# Patient Record
Sex: Female | Born: 1980 | Race: White | Hispanic: No | Marital: Single | Smoking: Former smoker
Health system: Southern US, Community
[De-identification: ages and names within clinical notes are randomized; demographics above are authoritative.]

## PROBLEM LIST (undated history)

## (undated) DIAGNOSIS — F419 Anxiety disorder, unspecified: Secondary | ICD-10-CM

## (undated) DIAGNOSIS — N390 Urinary tract infection, site not specified: Secondary | ICD-10-CM

## (undated) DIAGNOSIS — F329 Major depressive disorder, single episode, unspecified: Secondary | ICD-10-CM

## (undated) DIAGNOSIS — B009 Herpesviral infection, unspecified: Secondary | ICD-10-CM

## (undated) DIAGNOSIS — F32A Depression, unspecified: Secondary | ICD-10-CM

## (undated) HISTORY — DX: Herpesviral infection, unspecified: B00.9

## (undated) HISTORY — PX: OTHER SURGICAL HISTORY: SHX169

## (undated) HISTORY — DX: Major depressive disorder, single episode, unspecified: F32.9

## (undated) HISTORY — DX: Depression, unspecified: F32.A

---

## 2011-11-18 ENCOUNTER — Ambulatory Visit: Payer: BC Managed Care – PPO

## 2011-11-18 ENCOUNTER — Inpatient Hospital Stay (HOSPITAL_COMMUNITY)
Admission: AD | Admit: 2011-11-18 | Discharge: 2011-11-18 | Disposition: A | Discharge status: Home / Self Care | Payer: BC Managed Care – PPO | Source: Ambulatory Visit | Attending: Family Medicine | Admitting: Family Medicine

## 2011-11-18 ENCOUNTER — Inpatient Hospital Stay (HOSPITAL_COMMUNITY): Payer: BC Managed Care – PPO

## 2011-11-18 ENCOUNTER — Encounter (HOSPITAL_COMMUNITY): Payer: Self-pay

## 2011-11-18 ENCOUNTER — Ambulatory Visit (INDEPENDENT_AMBULATORY_CARE_PROVIDER_SITE_OTHER): Payer: BC Managed Care – PPO | Admitting: Family Medicine

## 2011-11-18 VITALS — BP 128/86 | HR 124 | Temp 99.0°F | Resp 16 | Ht 64.5 in | Wt 180.6 lb

## 2011-11-18 DIAGNOSIS — R109 Unspecified abdominal pain: Secondary | ICD-10-CM

## 2011-11-18 DIAGNOSIS — IMO0002 Reserved for concepts with insufficient information to code with codable children: Secondary | ICD-10-CM | POA: Insufficient documentation

## 2011-11-18 DIAGNOSIS — O0389 Complete or unspecified spontaneous abortion with other complications: Secondary | ICD-10-CM

## 2011-11-18 DIAGNOSIS — N898 Other specified noninflammatory disorders of vagina: Secondary | ICD-10-CM

## 2011-11-18 HISTORY — DX: Anxiety disorder, unspecified: F41.9

## 2011-11-18 HISTORY — DX: Urinary tract infection, site not specified: N39.0

## 2011-11-18 LAB — POCT UA - MICROSCOPIC ONLY
Casts, Ur, LPF, POC: NEGATIVE
Crystals, Ur, HPF, POC: NEGATIVE
Mucus, UA: NEGATIVE
Yeast, UA: NEGATIVE

## 2011-11-18 LAB — POCT WET PREP WITH KOH
KOH Prep POC: NEGATIVE
Trichomonas, UA: NEGATIVE
Yeast Wet Prep HPF POC: NEGATIVE

## 2011-11-18 LAB — POCT CBC
Granulocyte percent: 76.3 % (ref 37–80)
HCT, POC: 38.3 % (ref 37.7–47.9)
Hemoglobin: 11.4 g/dL — AB (ref 12.2–16.2)
Lymph, poc: 1.9 (ref 0.6–3.4)
MCH, POC: 27 pg (ref 27–31.2)
MCHC: 29.8 g/dL — AB (ref 31.8–35.4)
MCV: 90.7 fL (ref 80–97)
MID (cbc): 0.8 (ref 0–0.9)
MPV: 7.4 fL (ref 0–99.8)
POC Granulocyte: 8.7 — AB (ref 2–6.9)
POC LYMPH PERCENT: 16.9 % (ref 10–50)
POC MID %: 6.8 %M (ref 0–12)
Platelet Count, POC: 380 10*3/uL (ref 142–424)
RBC: 4.22 M/uL (ref 4.04–5.48)
RDW, POC: 13.6 %
WBC: 11.4 10*3/uL — AB (ref 4.6–10.2)

## 2011-11-18 LAB — POCT URINALYSIS DIPSTICK
Bilirubin, UA: NEGATIVE
Glucose, UA: NEGATIVE
Ketones, UA: NEGATIVE
Nitrite, UA: NEGATIVE
Protein, UA: NEGATIVE
Spec Grav, UA: <=1.005
Urobilinogen, UA: 0.2
pH, UA: 7

## 2011-11-18 LAB — POCT URINE PREGNANCY: Preg Test, Ur: POSITIVE

## 2011-11-18 LAB — HCG, QUANTITATIVE, PREGNANCY: hCG, Beta Chain, Quant, S: 31.3 m[IU]/mL

## 2011-11-18 MED ORDER — HYDROCODONE-ACETAMINOPHEN 5-325 MG PO TABS: 1.0000 | ORAL_TABLET | ORAL | Status: DC | PRN

## 2011-11-18 MED ORDER — DOXYCYCLINE HYCLATE 100 MG PO TABS: 100.0000 mg | ORAL_TABLET | Freq: Two times a day (BID) | ORAL | Status: DC

## 2011-11-18 MED ORDER — DOXYCYCLINE HYCLATE 100 MG PO TABS
100.0000 mg | ORAL_TABLET | Freq: Two times a day (BID) | ORAL | Status: DC
  Filled 2011-11-18: qty 1

## 2011-11-18 MED ORDER — HYDROCODONE-ACETAMINOPHEN 5-500 MG PO TABS: 1.0000 | ORAL_TABLET | Freq: Four times a day (QID) | ORAL | Status: DC | PRN

## 2011-11-18 NOTE — MAU Note (Signed)
Pt states vaginal bleeding is small compared to the last 2wks

## 2011-11-18 NOTE — MAU Note (Signed)
Patent is in with c/o generalized abdominal pain and small vaginal bleeding. She states that she had an abortion 3 weeks. She states that she was at urgent care today, her bhcg is still elevated. She have on pad that is scant blood on it.

## 2011-11-18 NOTE — Progress Notes (Signed)
[place] [facility] notified of patient

## 2011-11-18 NOTE — MAU Provider Note (Signed)
History     CSN: [id number]  Arrival date and time: 11/18/11 4098   First Provider Initiated Contact with Patient 11/18/11 2023      Chief Complaint  Patient presents with  . Abdominal Pain  . Vaginal Bleeding   HPI  Lisa Valdez is a 31 y.o. G2P1011 who had an elective termination 3 weeks ago. She has been spotting since. She states that it is lighter than a period, but heavier than just spotting. She states that the bleeding alternates between red and brown. She is also having abdominal pain. She states that the pain is > on the right, but radiates to the left. She states that is is an 8/10, and she hasn't found anything to make it better.   Past Medical History  Diagnosis Date  . Depression   . Anxiety   . UTI (lower urinary tract infection)     Past Surgical History  Procedure Date  . Surgical abortion     Family History  Problem Relation Age of Onset  . Hypertension Mother     History  Substance Use Topics  . Smoking status: Current Some Day Smoker  . Smokeless tobacco: Not on file  . Alcohol Use: Yes    Allergies: No Known Allergies  Prescriptions prior to admission  Medication Sig Dispense Refill  . escitalopram (LEXAPRO) 5 MG tablet Take 5 mg by mouth daily.      Marland Kitchen ibuprofen (ADVIL,MOTRIN) 200 MG tablet Take 600 mg by mouth every 6 (six) hours as needed. Takes for pain      . UNKNOWN TO PATIENT Take 1 tablet by mouth daily. Birth control pill given to pt as sample does not know name        Review of Systems  Constitutional: Negative for fever and chills.  Eyes: Negative for blurred vision.  Respiratory: Negative for cough.   Cardiovascular: Negative for chest pain.  Gastrointestinal: Positive for abdominal pain (see HPI). Negative for heartburn, nausea, vomiting, diarrhea and constipation.  Genitourinary: Negative for dysuria, urgency and frequency.  Musculoskeletal: Negative for myalgias.  Neurological: Negative for headaches.   Physical  Exam   Blood pressure 149/98, pulse 119, temperature 99.9 F (37.7 C), temperature source Oral, resp. rate 20, height 5\' 3"  (1.6 m), weight 81.704 kg (180 lb 2 oz), SpO2 98.00%, unknown if currently breastfeeding.  Physical Exam  Nursing note and vitals reviewed. Constitutional: She is oriented to person, place, and time. She appears well-developed and well-nourished.  HENT:  Head: Normocephalic.  Cardiovascular: Normal rate and regular rhythm.   Respiratory: Effort normal.  GI: Soft. She exhibits no mass. There is tenderness (suprapubically and  > on the right than the left). There is no rebound and no guarding.  Genitourinary:        External: normal Vagina: normal. Small amount of brown discharge in vault Cervix: pink, smooth. CMT present Uterus: enlarged, about 11 week size Adnexa: nontender  Neurological: She is alert and oriented to person, place, and time.    MAU Course  Procedures  TRANSABDOMINAL AND TRANSVAGINAL ULTRASOUND OF PELVIS  Technique: Both transabdominal and transvaginal ultrasound  examinations of the pelvis were performed. Transabdominal technique  was performed for global imaging of the pelvis including uterus,  ovaries, adnexal regions, and pelvic cul-de-sac.  It was necessary to proceed with endovaginal exam following the  transabdominal exam to visualize the uterus, ovaries, and adnexa .  Comparison: Plain films of the abdomen of earlier in the day.  Findings:  Uterus:  6.5 x 4.7 x 6.5 cm. Mildly heterogeneous myometrium,  likely postpartum.  Endometrium: Normal in thickness of 5 mm. Mildly heterogeneous.  No retained fetal parts.  Right ovary: Normal appearance/no adnexal mass  Left ovary: Normal appearance/no adnexal mass  Other findings: No free fluid  IMPRESSION:  Normal endometrial thickness with mild heterogeneity, nonspecific  in the post gravid state. No specific findings to strongly suggest  retained products of conception.  No other  explanation for pain.  Original Report Authenticated By: Consuello Bossier, M.D.   22:36 Spoke with Dr. Shawnie Pons, and reviewed ultrasound results. 10 days of doxycycline  Assessment and Plan   1. Infection post-abortion   Doxycycline 100mg  BID X 10days FU with PCP as scheduled.   Tawnya Crook 11/18/2011, 8:43 PM

## 2011-11-18 NOTE — Progress Notes (Signed)
 [facility]:  Office Visit  Chief Complaint:  Chief Complaint  Patient presents with  . Abdominal Pain    3 weeks ago had an abortion    HPI: Lisa Valdez is a 31 y.o. female G2A1L1 who complains of: 10/10 worsening bilateral abdominal cramps x 2-3 days. 3 weeks ago had a vaginal abortion via suction off [address]. Fetus was @ 11 weeks. She has had bleeding but it has decreased and it is now dark. She has had some minimal vaginal discharge. Patient took a home pregnancy test today and it was negative.   Past Medical History  Diagnosis Date  . Depression    Past Surgical History  Procedure Date  . Surgical abortion    History   Social History  . Marital Status: Single    Spouse Name: N/A    Number of Children: N/A  . Years of Education: N/A   Social History Main Topics  . Smoking status: Never Smoker   . Smokeless tobacco: None  . Alcohol Use: Yes  . Drug Use: No  . Sexually Active: None   Other Topics Concern  . None   Social History Narrative  . None   History reviewed. No pertinent family history. No Known Allergies Prior to Admission medications   Medication Sig Start Date End Date Taking? Authorizing Provider  escitalopram (LEXAPRO) 5 MG tablet Take 5 mg by mouth daily.   Yes Historical Provider, MD  UNKNOWN TO PATIENT Take 1 tablet by mouth daily. Birth control pill given to pt as sample does not know name   Yes Historical Provider, MD     ROS: The patient denies fevers, chills, night sweats, unintentional weight loss, chest pain, palpitations, wheezing, dyspnea on exertion, vomiting,  dysuria, hematuria, melena, numbness, weakness, or tingling.   All other systems have been reviewed and were otherwise negative with the exception of those mentioned in the HPI and as above.    PHYSICAL EXAM: Filed Vitals:   11/18/11 1624  BP: 128/86  Pulse: 124  Temp: 99 F (37.2 C)  Resp: 16   Filed Vitals:   11/18/11 1624  Height: 5'  4.5" (1.638 m)  Weight: 180 lb 9.6 oz (81.92 kg)   Body mass index is 30.52 kg/(m^2).  General: Alert, mild distress HEENT:  Normocephalic, atraumatic, oropharynx patent. EOMI Cardiovascular:  Regular rate and rhythm, no rubs murmurs or gallops.  No Carotid bruits, radial pulse intact. No pedal edema.  Respiratory: Clear to auscultation bilaterally.  No wheezes, rales, or rhonchi.  No cyanosis, no use of accessory musculature GI: No organomegaly, abdomen is soft but exquisitely tender bilateral upper quadrant, positive bowel sounds.  No masses. No rebound, no guarding Skin: No rashes. Neurologic: Facial musculature symmetric. Psychiatric: Patient is appropriate throughout our interaction. Lymphatic: No cervical lymphadenopathy Musculoskeletal: Gait intact. GU-cervical os closed, + purulent dc   LABS: Results for orders placed in visit on 11/18/11  POCT CBC      Component Value Range   WBC 11.4 (*) 4.6 - 10.2 K/uL   Lymph, poc 1.9  0.6 - 3.4   POC LYMPH PERCENT 16.9  10 - 50 %L   MID (cbc) 0.8  0 - 0.9   POC MID % 6.8  0 - 12 %M   POC Granulocyte 8.7 (*) 2 - 6.9   Granulocyte percent 76.3  37 - 80 %G   RBC 4.22  4.04 - 5.48 M/uL   Hemoglobin 11.4 (*) 12.2 - 16.2  g/dL   HCT, POC 04.5  40.9 - 47.9 %   MCV 90.7  80 - 97 fL   MCH, POC 27.0  27 - 31.2 pg   MCHC 29.8 (*) 31.8 - 35.4 g/dL   RDW, POC 81.1     Platelet Count, POC 380  142 - 424 K/uL   MPV 7.4  0 - 99.8 fL  POCT UA - MICROSCOPIC ONLY      Component Value Range   WBC, Ur, HPF, POC 3-5     RBC, urine, microscopic 0-1     Bacteria, U Microscopic small     Mucus, UA negative     Epithelial cells, urine per micros 0-3     Crystals, Ur, HPF, POC negative     Casts, Ur, LPF, POC negative     Yeast, UA negative    POCT URINALYSIS DIPSTICK      Component Value Range   Color, UA yellow     Clarity, UA clear     Glucose, UA neg     Bilirubin, UA neg     Ketones, UA neg     Spec Grav, UA <=1.005     Blood, UA trace      pH, UA 7.0     Protein, UA neg     Urobilinogen, UA 0.2     Nitrite, UA neg     Leukocytes, UA small (1+)    POCT URINE PREGNANCY      Component Value Range   Preg Test, Ur Positive    POCT WET PREP WITH KOH      Component Value Range   Trichomonas, UA Negative     Clue Cells Wet Prep HPF POC 1-2     Epithelial Wet Prep HPF POC 1-3     Yeast Wet Prep HPF POC neg     Bacteria Wet Prep HPF POC 2+     RBC Wet Prep HPF POC 1-3     WBC Wet Prep HPF POC 20-25     KOH Prep POC Negative       EKG/XRAY:   Primary read interpreted by Dr. Conley Rolls at [facility]. No acute process   ASSESSMENT/PLAN: Encounter Diagnoses  Name Primary?  . Abdominal cramps Yes  . Vaginal discharge    31 y/o  G65A1L1  White female with worsening abdominal cramps 3 weeks s/p abortion with elevated WBC and pretty unremarkable UA and wet prep.   ? Retained products of conception . I suspect her hcg to be elevated after such a short time after an abortion but I am worried that with an elevated white count and severe abdominal bilateral tenderness this needs further evaluation. Called OB/Gyn on call and asked for feedback. Dr. Royetta Crochet recommended sending her to [facility] ER for evaluation.  Pending labs: serum quant hcg, G/C gen probe, urine culture.     ,  PHUONG, DO 11/18/2011 6:42 PM

## 2011-11-19 ENCOUNTER — Telehealth: Payer: Self-pay | Admitting: Family Medicine

## 2011-11-19 ENCOUNTER — Encounter: Payer: Self-pay | Admitting: Family Medicine

## 2011-11-19 NOTE — MAU Provider Note (Signed)
Chart reviewed and agree with management and plan.  

## 2011-11-19 NOTE — Telephone Encounter (Signed)
LM regarding labs and checking in on how she is doing

## 2011-11-20 LAB — URINE CULTURE: Colony Count: 8000

## 2011-11-22 ENCOUNTER — Telehealth: Payer: Self-pay | Admitting: Family Medicine

## 2011-11-22 LAB — GC/CHLAMYDIA PROBE AMP, GENITAL
Chlamydia, DNA Probe: POSITIVE — AB
GC Probe Amp, Genital: NEGATIVE

## 2011-11-22 NOTE — Telephone Encounter (Signed)
patient was returning Dr. Conley Rolls call. Per Dr. Conley Rolls notify patient Chlamydia was positive and the doxycycline she put her on is appropriate treatment. She hopes she feels better. Patient stated she is feeling much better sxs have improved. Dr. Sundra Aland  I will sent [facility] card on this.

## 2011-11-22 NOTE — Telephone Encounter (Signed)
LM that I wanted to talk to her about labs. If she calls, one of the folks in the lab can let her know that she has Chlamydia and that was the cause of all her sxs She was appropriately treated with Doxycycline. IF she stopped taking that before she finished the rx then she needs to let us know. IF she wants her test results then she can pick it up at the office or we can mail it to her.

## 2012-01-05 ENCOUNTER — Emergency Department (HOSPITAL_COMMUNITY)
Admission: EM | Admit: 2012-01-05 | Discharge: 2012-01-05 | Disposition: A | Discharge status: Home / Self Care | Payer: BC Managed Care – PPO | Source: Home / Self Care

## 2012-01-05 ENCOUNTER — Encounter (HOSPITAL_COMMUNITY): Payer: Self-pay

## 2012-01-05 ENCOUNTER — Other Ambulatory Visit (HOSPITAL_COMMUNITY)
Admission: RE | Admit: 2012-01-05 | Discharge: 2012-01-05 | Disposition: A | Discharge status: Home / Self Care | Payer: BC Managed Care – PPO | Source: Ambulatory Visit | Attending: Family Medicine | Admitting: Family Medicine

## 2012-01-05 DIAGNOSIS — Z202 Contact with and (suspected) exposure to infections with a predominantly sexual mode of transmission: Secondary | ICD-10-CM

## 2012-01-05 DIAGNOSIS — Z113 Encounter for screening for infections with a predominantly sexual mode of transmission: Secondary | ICD-10-CM | POA: Insufficient documentation

## 2012-01-05 DIAGNOSIS — Z2089 Contact with and (suspected) exposure to other communicable diseases: Secondary | ICD-10-CM

## 2012-01-05 MED ORDER — AZITHROMYCIN 250 MG PO TABS
ORAL_TABLET | ORAL | Status: AC
  Filled 2012-01-05: qty 4

## 2012-01-05 MED ORDER — AZITHROMYCIN 250 MG PO TABS
1000.0000 mg | ORAL_TABLET | Freq: Every day | ORAL | Status: DC
  Administered 2012-01-05: 1000 mg via ORAL

## 2012-01-05 NOTE — ED Provider Notes (Signed)
History     CSN: [id number]  Arrival date & time 01/05/12  1806   First MD Initiated Contact with Patient 01/05/12 2014      Chief Complaint  Patient presents with  . Exposure to STD    (Consider location/radiation/quality/duration/timing/severity/associated sxs/prior treatment) HPI Comments: 31 year old female states he was to be tested for chlamydia. Her sexual partner was told recently that he had Chlamydia and she is warning to be tested and treated based on information. She is asymptomatic with exception of a scant vaginal discharge for 2 days. She has no pelvic pain or other symptoms. She declines to have a pelvic exam for further testing. We did obtain a urine for testing of the chlamydia and GC.  Patient is a 31 y.o. female presenting with STD exposure.  Exposure to STD    Past Medical History  Diagnosis Date  . Depression   . Anxiety   . UTI (lower urinary tract infection)     Past Surgical History  Procedure Date  . Surgical abortion     Family History  Problem Relation Age of Onset  . Hypertension Mother     History  Substance Use Topics  . Smoking status: Current Some Day Smoker  . Smokeless tobacco: Not on file  . Alcohol Use: Yes    OB History    Grav Para Term Preterm Abortions TAB SAB Ect Mult Living   2 1 1  1 1    1       Review of Systems  All other systems reviewed and are negative.    Allergies  Review of patient's allergies indicates no known allergies.  Home Medications   Current Outpatient Rx  Name  Route  Sig  Dispense  Refill  . ESCITALOPRAM OXALATE 10 MG PO TABS   Oral   Take 10 mg by mouth daily.         Marland Kitchen DOXYCYCLINE HYCLATE 100 MG PO TABS   Oral   Take 1 tablet (100 mg total) by mouth every 12 (twelve) hours.   19 tablet   0   . ESCITALOPRAM OXALATE 5 MG PO TABS   Oral   Take 5 mg by mouth daily.         Marland Kitchen HYDROCODONE-ACETAMINOPHEN 5-500 MG PO TABS   Oral   Take 1 tablet by mouth every 6 (six) hours as  needed for pain.   10 tablet   0   . IBUPROFEN 200 MG PO TABS   Oral   Take 600 mg by mouth every 6 (six) hours as needed. Takes for pain         . UNKNOWN TO PATIENT   Oral   Take 1 tablet by mouth daily. Birth control pill given to pt as sample does not know name           BP 144/97  Pulse 90  Temp 98.1 F (36.7 C) (Oral)  Resp 16  SpO2 99%  LMP 12/28/2011  Physical Exam  Nursing note and vitals reviewed. Constitutional: She is oriented to person, place, and time. She appears well-developed and well-nourished. No distress.  Eyes: Conjunctivae normal and EOM are normal.  Neck: Normal range of motion. Neck supple.  Pulmonary/Chest: Effort normal.  Musculoskeletal: Normal range of motion.  Neurological: She is alert and oriented to person, place, and time.  Skin: Skin is warm and dry.  Psychiatric: She has a normal mood and affect.    ED Course  Procedures (including critical care  time)   Labs Reviewed  URINE CYTOLOGY ANCILLARY ONLY   No results found.   1. Exposure to STD       MDM  Patient declined having a pelvic exam and further testing. She only requests confirmation of Chlamydia and treatment. Be obtained a urine and we will test for GC and Chlamydia. Treat with 1 g azithromycin tonight.        Hayden Rasmussen, NP 01/05/12 2037

## 2012-01-05 NOTE — ED Notes (Signed)
Patient states that her boyfriend told her that he tested positive for chlamydia, she would like to be tested and treated today

## 2012-01-08 NOTE — ED Provider Notes (Signed)
Medical screening examination/treatment/procedure(s) were performed by resident physician or non-physician practitioner and as supervising physician I was immediately available for consultation/collaboration.   Ellakate Gonsalves DOUGLAS MD.    Samyah Bilbo D Caliah Kopke, MD 01/08/12 1110 

## 2012-02-10 ENCOUNTER — Ambulatory Visit (INDEPENDENT_AMBULATORY_CARE_PROVIDER_SITE_OTHER): Payer: BC Managed Care – PPO | Admitting: Physician Assistant

## 2012-02-10 VITALS — BP 144/93 | HR 94 | Temp 98.6°F | Resp 16 | Ht 64.0 in | Wt 177.0 lb

## 2012-02-10 DIAGNOSIS — R059 Cough, unspecified: Secondary | ICD-10-CM

## 2012-02-10 DIAGNOSIS — R05 Cough: Secondary | ICD-10-CM

## 2012-02-10 DIAGNOSIS — J329 Chronic sinusitis, unspecified: Secondary | ICD-10-CM

## 2012-02-10 DIAGNOSIS — Z131 Encounter for screening for diabetes mellitus: Secondary | ICD-10-CM

## 2012-02-10 LAB — POCT CBC
Granulocyte percent: 55.6 %G (ref 37–80)
HCT, POC: 41 % (ref 37.7–47.9)
Hemoglobin: 12.8 g/dL (ref 12.2–16.2)
Lymph, poc: 3.2 (ref 0.6–3.4)
MCH, POC: 27.8 pg (ref 27–31.2)
MCHC: 31.2 g/dL — AB (ref 31.8–35.4)
MCV: 89.1 fL (ref 80–97)
MID (cbc): 0.6 (ref 0–0.9)
MPV: 8.1 fL (ref 0–99.8)
POC Granulocyte: 4.8 (ref 2–6.9)
POC LYMPH PERCENT: 37.3 %L (ref 10–50)
POC MID %: 7.1 %M (ref 0–12)
Platelet Count, POC: 313 10*3/uL (ref 142–424)
RBC: 4.6 M/uL (ref 4.04–5.48)
RDW, POC: 13.4 %
WBC: 8.7 10*3/uL (ref 4.6–10.2)

## 2012-02-10 LAB — GLUCOSE, POCT (MANUAL RESULT ENTRY): POC Glucose: 95 mg/dl (ref 70–99)

## 2012-02-10 MED ORDER — FLUTICASONE PROPIONATE 50 MCG/ACT NA SUSP: 2.0000 | Freq: Every day | NASAL | Status: DC

## 2012-02-10 MED ORDER — HYDROCOD POLST-CHLORPHEN POLST 10-8 MG/5ML PO LQCR: 5.0000 mL | Freq: Two times a day (BID) | ORAL | Status: DC | PRN

## 2012-02-10 MED ORDER — AMOXICILLIN-POT CLAVULANATE 875-125 MG PO TABS: 1.0000 | ORAL_TABLET | Freq: Two times a day (BID) | ORAL | Status: DC

## 2012-02-10 MED ORDER — PREDNISONE 20 MG PO TABS: ORAL_TABLET | ORAL | Status: DC

## 2012-02-10 NOTE — Progress Notes (Signed)
  Subjective:    Patient ID: Lisa Valdez, female    DOB: September 26, 1980, 32 y.o.   MRN: [id number]  HPI 32 year old female presents with 1 week history of nasal congestion, sinus pressure, sore throat, dry cough, and thick nasal congestion.  Does have a history of sinus infections in the past with the last being >1 year ago.  Denies fever, chills, nausea, vomiting, vision changes, neck pain, or otalgia. She has been using sudafed regularly which has not helped much.  She is otherwise healthy with no other complaints today.      Review of Systems  Constitutional: Negative for fever and chills.  HENT: Positive for congestion, sore throat and sinus pressure. Negative for ear pain and neck pain.   Respiratory: Positive for cough. Negative for chest tightness, shortness of breath and wheezing.   Gastrointestinal: Negative for nausea, vomiting and abdominal pain.  Skin: Negative for rash.  Neurological: Negative for dizziness, light-headedness and headaches.  All other systems reviewed and are negative.       Objective:   Physical Exam  Constitutional: She is oriented to person, place, and time. She appears well-developed and well-nourished.  HENT:  Head: Normocephalic and atraumatic.  Right Ear: Hearing, tympanic membrane, external ear and ear canal normal.  Left Ear: Hearing, tympanic membrane, external ear and ear canal normal.  Nose: Right sinus exhibits maxillary sinus tenderness. Right sinus exhibits no frontal sinus tenderness. Left sinus exhibits maxillary sinus tenderness. Left sinus exhibits no frontal sinus tenderness.  Mouth/Throat: Oropharynx is clear and moist. No oropharyngeal exudate (clear postnasal drainage).  Eyes: Conjunctivae normal are normal.  Neck: Normal range of motion. Neck supple.  Cardiovascular: Normal rate, regular rhythm and normal heart sounds.   Pulmonary/Chest: Effort normal and breath sounds normal.  Lymphadenopathy:    She has no cervical adenopathy.    Neurological: She is alert and oriented to person, place, and time.  Psychiatric: She has a normal mood and affect. Her behavior is normal. Judgment and thought content normal.          Assessment & Plan:   1. Sinus infection  amoxicillin-clavulanate (AUGMENTIN) 875-125 MG per tablet, fluticasone (FLONASE) 50 MCG/ACT nasal spray, POCT CBC  2. Cough  chlorpheniramine-HYDROcodone (TUSSIONEX PENNKINETIC ER) 10-8 MG/5ML LQCR  3. Screening for diabetes mellitus  POCT glucose (manual entry)  Augmentin 875 mg bid Prednisone taper Flonase bid Tussionex q12hours prn cough Follow up if symptoms worsen or fail to improve.

## 2012-06-13 ENCOUNTER — Other Ambulatory Visit (INDEPENDENT_AMBULATORY_CARE_PROVIDER_SITE_OTHER): Payer: BC Managed Care – PPO

## 2012-06-13 ENCOUNTER — Ambulatory Visit (INDEPENDENT_AMBULATORY_CARE_PROVIDER_SITE_OTHER): Payer: BC Managed Care – PPO | Admitting: Obstetrics

## 2012-06-13 ENCOUNTER — Encounter: Payer: Self-pay | Admitting: Obstetrics

## 2012-06-13 VITALS — BP 129/92 | HR 95 | Temp 98.3°F | Ht 64.0 in | Wt 185.0 lb

## 2012-06-13 DIAGNOSIS — Z113 Encounter for screening for infections with a predominantly sexual mode of transmission: Secondary | ICD-10-CM

## 2012-06-13 DIAGNOSIS — N949 Unspecified condition associated with female genital organs and menstrual cycle: Secondary | ICD-10-CM | POA: Insufficient documentation

## 2012-06-13 DIAGNOSIS — R1032 Left lower quadrant pain: Secondary | ICD-10-CM

## 2012-06-13 DIAGNOSIS — Z01419 Encounter for gynecological examination (general) (routine) without abnormal findings: Secondary | ICD-10-CM

## 2012-06-13 LAB — POCT URINALYSIS DIPSTICK
Glucose, UA: NEGATIVE
Spec Grav, UA: 1.015
Urobilinogen, UA: NEGATIVE

## 2012-06-13 LAB — CBC WITH DIFFERENTIAL/PLATELET
Basophils Absolute: 0 10*3/uL (ref 0.0–0.1)
Basophils Relative: 0 % (ref 0–1)
Lymphocytes Relative: 42 % (ref 12–46)
MCHC: 33.7 g/dL (ref 30.0–36.0)
Neutro Abs: 3.7 10*3/uL (ref 1.7–7.7)
Neutrophils Relative %: 49 % (ref 43–77)
Platelets: 348 10*3/uL (ref 150–400)
RDW: 12.7 % (ref 11.5–15.5)
WBC: 7.5 10*3/uL (ref 4.0–10.5)

## 2012-06-13 LAB — COMPREHENSIVE METABOLIC PANEL
Albumin: 4.1 g/dL (ref 3.5–5.2)
CO2: 22 mEq/L (ref 19–32)
Calcium: 9.2 mg/dL (ref 8.4–10.5)
Chloride: 104 mEq/L (ref 96–112)
Glucose, Bld: 74 mg/dL (ref 70–99)
Potassium: 4.1 mEq/L (ref 3.5–5.3)
Sodium: 137 mEq/L (ref 135–145)
Total Bilirubin: 0.5 mg/dL (ref 0.3–1.2)
Total Protein: 6.7 g/dL (ref 6.0–8.3)

## 2012-06-13 LAB — TSH: TSH: 1.265 u[IU]/mL (ref 0.350–4.500)

## 2012-06-13 MED ORDER — IBUPROFEN 800 MG PO TABS: 800.0000 mg | ORAL_TABLET | Freq: Three times a day (TID) | ORAL | Status: DC | PRN

## 2012-06-13 NOTE — Progress Notes (Signed)
Subjective:     Lisa Valdez is a 32 y.o. female here routine examinaion.  Current complaints: abdominal pain.     Gynecologic History Patient's last menstrual period was 05/23/2012. Contraception: OCP (estrogen/progesterone) Last PAP: 2012, results: normal; Mammogram: 2011, results: normal   Obstetric History OB History   Grav Para Term Preterm Abortions TAB SAB Ect Mult Living   2 1 1  1 1    1      # Outc Date GA Lbr Len/2nd Wgt Sex Del Anes PTL Lv   1 TRM            2 TAB                The following portions of the patient's history were reviewed and updated as appropriate: allergies, current medications, past family history, past medical history, past social history, past surgical history and problem list.  Review of Systems Pertinent items are noted in HPI.    Objective:    General appearance: alert and no distress Abdomen: normal findings: soft, non-tender Pelvic: cervix normal in appearance, exam obscured by obesity, external genitalia normal, no cervical motion tenderness, uterus normal size, shape, and consistency, vagina normal without discharge and Adnexal tenderness present.    Assessment:    Healthy female exam.   R/O ovarian cyst Plan:    Follow up in: 2 weeks.    Ultrasound scheduled.

## 2012-06-14 ENCOUNTER — Ambulatory Visit (INDEPENDENT_AMBULATORY_CARE_PROVIDER_SITE_OTHER): Payer: BC Managed Care – PPO | Admitting: Family Medicine

## 2012-06-14 ENCOUNTER — Ambulatory Visit: Payer: BC Managed Care – PPO | Admitting: Obstetrics & Gynecology

## 2012-06-14 ENCOUNTER — Encounter: Payer: Self-pay | Admitting: Obstetrics

## 2012-06-14 VITALS — BP 132/86 | HR 95 | Temp 99.2°F | Resp 16 | Ht 63.0 in | Wt 186.0 lb

## 2012-06-14 DIAGNOSIS — K219 Gastro-esophageal reflux disease without esophagitis: Secondary | ICD-10-CM

## 2012-06-14 DIAGNOSIS — R109 Unspecified abdominal pain: Secondary | ICD-10-CM

## 2012-06-14 LAB — WET PREP BY MOLECULAR PROBE
Gardnerella vaginalis: POSITIVE — AB
Trichomonas vaginosis: NEGATIVE

## 2012-06-14 LAB — PAP IG W/ RFLX HPV ASCU

## 2012-06-14 LAB — HEPATITIS B SURFACE ANTIGEN: Hepatitis B Surface Ag: NEGATIVE

## 2012-06-14 MED ORDER — OMEPRAZOLE 40 MG PO CPDR: 40.0000 mg | DELAYED_RELEASE_CAPSULE | Freq: Every day | ORAL | Status: DC

## 2012-06-14 NOTE — Progress Notes (Signed)
  Subjective:    Patient ID: Lisa Valdez, female    DOB: 1981/01/15, 32 y.o.   MRN: [id number]  HPI Patient is a 32 year old female coming in with abdominal pain for 4 day history. Patient states that she's been having midepigastric pain for quite some time. This pain has woken up at night. Patient denies any radiation of pain, denies any bowel or bladder changes, denies any darkening of the stool. Patient also denies any fevers or chills or any nausea or vomiting. Patient states that the pain seems to be more in the midepigastric that does radiate down her esophagus. Patient does wake up with a bitter taste in her mouth at approximately 3 AM every morning. Patient states that she has been having more stress but denies any association with food at this time the   Review of Systems As stated above in the history of present illness  Past Medical History  Diagnosis Date  . Depression   . Anxiety   . UTI (lower urinary tract infection)   . HSV-2 (herpes simplex virus 2) infection    Past Surgical History  Procedure Laterality Date  . Surgical abortion     Family History  Problem Relation Age of Onset  . Hypertension Mother    History  Substance Use Topics  . Smoking status: Current Every Day Smoker -- 0.30 packs/day for 2 years    Types: Cigarettes  . Smokeless tobacco: Never Used  . Alcohol Use: Yes     Comment: rare        Objective:   Physical Exam Blood pressure 132/86, pulse 95, temperature 99.2 F (37.3 C), temperature source Oral, resp. rate 16, height 5\' 3"  (1.6 m), weight 186 lb (84.369 kg), last menstrual period 05/23/2012, SpO2 99.00%. General: No apparent distress alert and oriented x3 mood and affect normal Respiratory: Patient's speak in full sentences and does not appear short of breath Skin: Warm dry intact with no signs of infection or rash Neuro: Cranial nerves II through XII are intact, neurovascularly intact in all extremities with 2+ DTRs and 2+  pulses. Abdominal exam: Thousand positive, patient is tender in the epigastric region but has no involuntary guarding and no rebound tenderness. No masses palpated       Assessment & Plan:  Gastritis likely gastroesophageal reflux disease  Patient was given a GI cocktail and did have improvement of symptoms. In addition this patient will do omeprazole high dose twice a day for the next week once daily thereafter. Patient will followup in one week to make sure she continues to improve. Patient was given a handout discussing different symptoms to make her seek medical attention sooner. We'll see patient again in one week

## 2012-06-14 NOTE — Patient Instructions (Signed)
Very nice to meet you I am giving you a pill called omeprazole.  Take it twice daily for 1 week then daily thereafter.  Come back again in 1 week to tell me how the party went, and I should also check to make sure you are feeling better.  My name is Dr. Katrinka Blazing.     Gastritis, Adult Gastritis is soreness and swelling (inflammation) of the lining of the stomach. Gastritis can develop as a sudden onset (acute) or long-term (chronic) condition. If gastritis is not treated, it can lead to stomach bleeding and ulcers. CAUSES  Gastritis occurs when the stomach lining is weak or damaged. Digestive juices from the stomach then inflame the weakened stomach lining. The stomach lining may be weak or damaged due to viral or bacterial infections. One common bacterial infection is the Helicobacter pylori infection. Gastritis can also result from excessive alcohol consumption, taking certain medicines, or having too much acid in the stomach.  SYMPTOMS  In some cases, there are no symptoms. When symptoms are present, they may include:  Pain or a burning sensation in the upper abdomen.  Nausea.  Vomiting.  An uncomfortable feeling of fullness after eating. DIAGNOSIS  Your caregiver may suspect you have gastritis based on your symptoms and a physical exam. To determine the cause of your gastritis, your caregiver may perform the following:  Blood or stool tests to check for the H pylori bacterium.  Gastroscopy. A thin, flexible tube (endoscope) is passed down the esophagus and into the stomach. The endoscope has a light and camera on the end. Your caregiver uses the endoscope to view the inside of the stomach.  Taking a tissue sample (biopsy) from the stomach to examine under a microscope. TREATMENT  Depending on the cause of your gastritis, medicines may be prescribed. If you have a bacterial infection, such as an H pylori infection, antibiotics may be given. If your gastritis is caused by too much acid in  the stomach, H2 blockers or antacids may be given. Your caregiver may recommend that you stop taking aspirin, ibuprofen, or other nonsteroidal anti-inflammatory drugs (NSAIDs). HOME CARE INSTRUCTIONS  Only take over-the-counter or prescription medicines as directed by your caregiver.  If you were given antibiotic medicines, take them as directed. Finish them even if you start to feel better.  Drink enough fluids to keep your urine clear or pale yellow.  Avoid foods and drinks that make your symptoms worse, such as:  Caffeine or alcoholic drinks.  Chocolate.  Peppermint or mint flavorings.  Garlic and onions.  Spicy foods.  Citrus fruits, such as oranges, lemons, or limes.  Tomato-based foods such as sauce, chili, salsa, and pizza.  Fried and fatty foods.  Eat small, frequent meals instead of large meals. SEEK IMMEDIATE MEDICAL CARE IF:   You have black or dark red stools.  You vomit blood or material that looks like coffee grounds.  You are unable to keep fluids down.  Your abdominal pain gets worse.  You have a fever.  You do not feel better after 1 week.  You have any other questions or concerns. MAKE SURE YOU:  Understand these instructions.  Will watch your condition.  Will get help right away if you are not doing well or get worse. Document Released: 01/12/2001 Document Revised: 07/20/2011 Document Reviewed: 03/03/2011 [facility] Patient Information 2013 [place], [place].

## 2012-06-21 ENCOUNTER — Encounter: Payer: Self-pay | Admitting: Obstetrics

## 2012-07-11 ENCOUNTER — Encounter: Payer: Self-pay | Admitting: Obstetrics

## 2012-08-11 ENCOUNTER — Ambulatory Visit: Payer: BC Managed Care – PPO | Admitting: Obstetrics & Gynecology

## 2012-08-11 ENCOUNTER — Ambulatory Visit (INDEPENDENT_AMBULATORY_CARE_PROVIDER_SITE_OTHER): Payer: BC Managed Care – PPO | Admitting: Obstetrics & Gynecology

## 2012-08-11 ENCOUNTER — Encounter: Payer: Self-pay | Admitting: Obstetrics & Gynecology

## 2012-08-11 VITALS — BP 124/76 | HR 74 | Temp 97.8°F | Wt 188.0 lb

## 2012-08-11 DIAGNOSIS — Z309 Encounter for contraceptive management, unspecified: Secondary | ICD-10-CM

## 2012-08-11 DIAGNOSIS — Z3202 Encounter for pregnancy test, result negative: Secondary | ICD-10-CM

## 2012-08-11 DIAGNOSIS — Z30430 Encounter for insertion of intrauterine contraceptive device: Secondary | ICD-10-CM

## 2012-08-11 MED ORDER — LEVONORGESTREL 20 MCG/24HR IU IUD: INTRAUTERINE_SYSTEM | Freq: Once | INTRAUTERINE | Status: AC

## 2012-08-11 NOTE — Patient Instructions (Addendum)
Levonorgestrel intrauterine device (IUD) What is this medicine? LEVONORGESTREL IUD (LEE voe nor jes trel) is a contraceptive (birth control) device. The device is placed inside the uterus by a healthcare professional. It is used to prevent pregnancy and can also be used to treat heavy bleeding that occurs during your period. Depending on the device, it can be used for 3 to 5 years. This medicine may be used for other purposes; ask your health care provider or pharmacist if you have questions. What should I tell my health care provider before I take this medicine? They need to know if you have any of these conditions: -abnormal Pap smear -cancer of the breast, uterus, or cervix -diabetes -endometritis -genital or pelvic infection now or in the past -have more than one sexual partner or your partner has more than one partner -heart disease -history of an ectopic or tubal pregnancy -immune system problems -IUD in place -liver disease or tumor -problems with blood clots or take blood-thinners -use intravenous drugs -uterus of unusual shape -vaginal bleeding that has not been explained -an unusual or allergic reaction to levonorgestrel, other hormones, silicone, or polyethylene, medicines, foods, dyes, or preservatives -pregnant or trying to get pregnant -breast-feeding How should I use this medicine? This device is placed inside the uterus by a health care professional. Talk to your pediatrician regarding the use of this medicine in children. Special care may be needed. Overdosage: If you think you have taken too much of this medicine contact a poison control center or emergency room at once. NOTE: This medicine is only for you. Do not share this medicine with others. What if I miss a dose? This does not apply. What may interact with this medicine? Do not take this medicine with any of the following medications: -amprenavir -bosentan -fosamprenavir This medicine may also interact with  the following medications: -aprepitant -barbiturate medicines for inducing sleep or treating seizures -bexarotene -griseofulvin -medicines to treat seizures like carbamazepine, ethotoin, felbamate, oxcarbazepine, phenytoin, topiramate -modafinil -pioglitazone -rifabutin -rifampin -rifapentine -some medicines to treat HIV infection like atazanavir, indinavir, lopinavir, nelfinavir, tipranavir, ritonavir -St. John's wort -warfarin This list may not describe all possible interactions. Give your health care provider a list of all the medicines, herbs, non-prescription drugs, or dietary supplements you use. Also tell them if you smoke, drink alcohol, or use illegal drugs. Some items may interact with your medicine. What should I watch for while using this medicine? Visit your doctor or health care professional for regular check ups. See your doctor if you or your partner has sexual contact with others, becomes HIV positive, or gets a sexual transmitted disease. This product does not protect you against HIV infection (AIDS) or other sexually transmitted diseases. You can check the placement of the IUD yourself by reaching up to the top of your vagina with clean fingers to feel the threads. Do not pull on the threads. It is a good habit to check placement after each menstrual period. Call your doctor right away if you feel more of the IUD than just the threads or if you cannot feel the threads at all. The IUD may come out by itself. You may become pregnant if the device comes out. If you notice that the IUD has come out use a backup birth control method like condoms and call your health care provider. Using tampons will not change the position of the IUD and are okay to use during your period. What side effects may I notice from receiving this medicine?   Side effects that you should report to your doctor or health care professional as soon as possible: -allergic reactions like skin rash, itching or  hives, swelling of the face, lips, or tongue -fever, flu-like symptoms -genital sores -high blood pressure -no menstrual period for 6 weeks during use -pain, swelling, warmth in the leg -pelvic pain or tenderness -severe or sudden headache -signs of pregnancy -stomach cramping -sudden shortness of breath -trouble with balance, talking, or walking -unusual vaginal bleeding, discharge -yellowing of the eyes or skin Side effects that usually do not require medical attention (report to your doctor or health care professional if they continue or are bothersome): -acne -breast pain -change in sex drive or performance -changes in weight -cramping, dizziness, or faintness while the device is being inserted -headache -irregular menstrual bleeding within first 3 to 6 months of use -nausea This list may not describe all possible side effects. Call your doctor for medical advice about side effects. You may report side effects to FDA at 1-800-FDA-1088. Where should I keep my medicine? This does not apply. NOTE: This sheet is a summary. It may not cover all possible information. If you have questions about this medicine, talk to your doctor, pharmacist, or health care provider.  2013, [employer]. (02/18/2011 1:54:04 PM)  

## 2012-08-11 NOTE — Progress Notes (Signed)
IUD Insertion Procedure Note    Indications: contraception  Procedure Details  Urine pregnancy test was done today and result was negative.  The risks (including infection, bleeding, pain, and uterine perforation) and benefits of the procedure were explained to the patient and verbal and written informed consent was obtained.    Cervix cleansed with Betadine. Uterus sounded to 7 cm. IUD inserted without difficulty. String visible and trimmed. Patient tolerated procedure well.  IUD Information: Mirena.  Condition: Stable  Complications: None  Plan:  The patient was advised to call for any fever or for prolonged or severe pain or bleeding. She was advised to use OTC analgesics as needed for mild to moderate pain.

## 2012-09-11 ENCOUNTER — Encounter: Payer: Self-pay | Admitting: Obstetrics & Gynecology

## 2012-09-11 ENCOUNTER — Ambulatory Visit (INDEPENDENT_AMBULATORY_CARE_PROVIDER_SITE_OTHER): Payer: BC Managed Care – PPO | Admitting: Obstetrics & Gynecology

## 2012-09-11 ENCOUNTER — Ambulatory Visit: Payer: BC Managed Care – PPO | Admitting: Obstetrics & Gynecology

## 2012-09-11 VITALS — BP 127/88 | HR 91 | Temp 98.8°F | Ht 64.0 in | Wt 183.0 lb

## 2012-09-11 DIAGNOSIS — Z30431 Encounter for routine checking of intrauterine contraceptive device: Secondary | ICD-10-CM

## 2012-09-11 NOTE — Progress Notes (Signed)
Subjective:     Tehya Leath is a 32 y.o. female here for a routine exam.  Current complaints: pt here today for a Mirena IUD check. Pt states she  Personal health questionnaire reviewed: yes.   Gynecologic History Patient's last menstrual period was 09/02/2012. Contraception: IUD  Obstetric History OB History   Grav Para Term Preterm Abortions TAB SAB Ect Mult Living   2 1 1  1 1    1      # Outc Date GA Lbr Len/2nd Wgt Sex Del Anes PTL Lv   1 TRM            2 TAB                The following portions of the patient's history were reviewed and updated as appropriate: allergies, current medications, past family history, past medical history, past social history, past surgical history and problem list.  Review of Systems Pertinent items are noted in HPI.    Objective:      General:  alert     Abdomen: soft, non-tender; bowel sounds normal; no masses,  no organomegaly   Vulva:  normal  Vagina: Small heme  Cervix:  no lesions; IUD strings seen  Corpus: normal size, contour, position, consistency, mobility, non-tender  Adnexa:  normal adnexa       Assessment:   Exam OK s/p IUD placement  Plan:   Return prn

## 2013-01-01 ENCOUNTER — Other Ambulatory Visit: Payer: Self-pay | Admitting: Obstetrics & Gynecology

## 2013-01-08 ENCOUNTER — Encounter: Payer: Self-pay | Admitting: Obstetrics

## 2013-12-03 ENCOUNTER — Encounter: Payer: Self-pay | Admitting: Obstetrics & Gynecology

## 2014-01-28 ENCOUNTER — Encounter: Payer: Self-pay | Admitting: *Deleted

## 2014-01-29 ENCOUNTER — Encounter: Payer: Self-pay | Admitting: Obstetrics & Gynecology

## 2015-04-16 ENCOUNTER — Ambulatory Visit (INDEPENDENT_AMBULATORY_CARE_PROVIDER_SITE_OTHER): Payer: BC Managed Care – PPO | Admitting: Family Medicine

## 2015-04-16 VITALS — BP 130/80 | HR 116 | Temp 102.9°F | Resp 16 | Ht 65.0 in | Wt 179.0 lb

## 2015-04-16 DIAGNOSIS — J111 Influenza due to unidentified influenza virus with other respiratory manifestations: Secondary | ICD-10-CM | POA: Diagnosis not present

## 2015-04-16 DIAGNOSIS — R509 Fever, unspecified: Secondary | ICD-10-CM

## 2015-04-16 MED ORDER — OSELTAMIVIR PHOSPHATE 75 MG PO CAPS: 75.0000 mg | ORAL_CAPSULE | Freq: Two times a day (BID) | ORAL | Status: DC

## 2015-04-16 MED ORDER — ACETAMINOPHEN 325 MG PO TABS
1000.0000 mg | ORAL_TABLET | Freq: Once | ORAL | Status: AC
  Administered 2015-04-16: 975 mg via ORAL

## 2015-04-16 MED ORDER — HYDROCODONE-HOMATROPINE 5-1.5 MG/5ML PO SYRP: 5.0000 mL | ORAL_SOLUTION | Freq: Three times a day (TID) | ORAL | Status: DC | PRN

## 2015-04-16 NOTE — Progress Notes (Signed)
Patient ID: Lisa Valdez, female   DOB: [birth year], 35 y.o.   MRN: [id number]  By signing my name below, I, Essence Howell, attest that this documentation has been prepared under the direction and in the presence of Elvina Sidle, MD Electronically Signed: Charline Bills, ED Scribe 04/16/2015 at 12:40 PM.  Patient ID: Lisa Valdez MRN: [id number], DOB: [birth year], 35 y.o. Date of Encounter: 04/16/2015, 12:29 PM  Primary Physician: Roseanna Rainbow, MD  Chief Complaint:  Chief Complaint  Patient presents with   Fever    x 1 day   Generalized Body Aches   Cough   Nasal Congestion   HPI: 35 y.o. year old female with history below presents with peristsent fever with Tmax of 102.9 F onset yesterday. Pt reports associated chills, nasal congestion, rhinorrhea, sneezing, cough, generalized body aches. No treatments tried PTA. She denies vomiting. Pt did not receive a flu vaccine this year.   Pt is a Pension scheme manager at [employer]. She is in the midst of buying a house.   Past Medical History  Diagnosis Date   Depression    Anxiety    UTI (lower urinary tract infection)    HSV-2 (herpes simplex virus 2) infection     Home Meds: Prior to Admission medications   Not on File   Allergies: No Known Allergies  Social History   Social History   Marital Status: Single    Spouse Name: N/A   Number of Children: N/A   Years of Education: N/A   Occupational History   Not on file.   Social History Main Topics   Smoking status: Current Every Day Smoker -- 0.30 packs/day for 2 years    Types: Cigarettes   Smokeless tobacco: Never Used   Alcohol Use: 1.2 - 1.8 oz/week    2-3 Standard drinks or equivalent per week     Comment: rare    Drug Use: No   Sexual Activity: Not Currently    Birth Control/ Protection: IUD   Other Topics Concern   Not on file   Social History Narrative    Review of Systems: Constitutional: negative for  night sweats, weight changes, or fatigue, +chills, +fever HEENT: negative for vision changes, hearing loss, ST, epistaxis, or sinus pressure, +rhinorrhea, +congestion, +sneezing Cardiovascular: negative for chest pain or palpitations Respiratory: negative for hemoptysis, wheezing, shortness of breath, +cough Msk: +myalgias  Abdominal: negative for abdominal pain, nausea, -vomiting, diarrhea, or constipation Dermatological: negative for rash Neurologic: negative for headache, dizziness, or syncope All other systems reviewed and are otherwise negative with the exception to those above and in the HPI.  Physical Exam: Blood pressure 130/80, pulse 116, temperature 102.9 F (39.4 C), temperature source Oral, resp. rate 16, height  (1.651 m), weight 179 lb (81.194 kg), last menstrual period 04/02/2015, SpO2 95 %., Body mass index is 29.79 kg/(m^2). General: Well developed, well nourished, in no acute distress. Head: Normocephalic, atraumatic, eyes without discharge, sclera non-icteric. Rhinorrhea. Bilateral auditory canals clear, TM's are normal, without perforation, pearly grey and translucent with reflective cone of light bilaterally. Oral cavity moist, posterior pharynx without exudate, erythema, peritonsillar abscess, or post nasal drip.  Neck: Supple. No thyromegaly. Full ROM. No lymphadenopathy. Lungs: Clear bilaterally to auscultation without wheezes, rales, or rhonchi. Congested cough.  Heart: RRR with S1 S2. No murmurs, rubs, or gallops appreciated. Abdomen: Soft, non-tender, non-distended with normoactive bowel sounds. No hepatomegaly. No rebound/guarding. No obvious abdominal masses. Msk:  Strength and tone normal for age.  Extremities/Skin: Warm and dry. No clubbing or cyanosis. No edema. No rashes or suspicious lesions. Neuro: Alert and oriented X 3. Moves all extremities spontaneously. Gait is normal. CNII-XII grossly in tact. Psych:  Responds to questions appropriately with a normal  affect.   Labs:  ASSESSMENT AND PLAN:  35 y.o. year old female with  1. Influenza with respiratory manifestation   2. Fever, unspecified    This chart was scribed in my presence and reviewed by me personally.    ICD-9-CM ICD-10-CM   1. Influenza with respiratory manifestation 487.1 J11.1 oseltamivir (TAMIFLU) 75 MG capsule     HYDROcodone-homatropine (HYCODAN) 5-1.5 MG/5ML syrup  2. Fever, unspecified 780.60 R50.9 acetaminophen (TYLENOL) tablet 975 mg     Signed, Elvina SidleKurt Lauenstein, MD 04/16/2015 12:29 PM

## 2015-04-16 NOTE — Patient Instructions (Addendum)

## 2015-10-27 ENCOUNTER — Encounter (HOSPITAL_COMMUNITY): Payer: Self-pay | Admitting: Emergency Medicine

## 2015-10-27 ENCOUNTER — Ambulatory Visit (HOSPITAL_COMMUNITY)
Admission: EM | Admit: 2015-10-27 | Discharge: 2015-10-27 | Disposition: A | Discharge status: Home / Self Care | Payer: BC Managed Care – PPO | Attending: Family Medicine | Admitting: Family Medicine

## 2015-10-27 DIAGNOSIS — K2970 Gastritis, unspecified, without bleeding: Secondary | ICD-10-CM | POA: Diagnosis not present

## 2015-10-27 NOTE — ED Triage Notes (Signed)
[facility] a [facility] of N/V/D that started last night after eating. The patient stated that she is under a lot of stress and anxiety and that may be a cause of her GI distress.

## 2015-10-27 NOTE — ED Provider Notes (Signed)
CSN: [id number]     Arrival date & time 10/27/15  1650 History   First MD Initiated Contact with Patient 10/27/15 1833     Chief Complaint  Patient presents with  . Nausea   (Consider location/radiation/quality/duration/timing/severity/associated sxs/prior Treatment) HPI 35 year old female with 2 day history of nausea and vomiting. She states her symptoms started after eating a hamburger and fries that she bought at [employer]. She states her symptoms are much better at this time although she still feels a bit nauseated. He has ceased there is been no diarrhea no 1 else in the house is sick Past Medical History:  Diagnosis Date  . Anxiety   . Depression   . HSV-2 (herpes simplex virus 2) infection   . UTI (lower urinary tract infection)    Past Surgical History:  Procedure Laterality Date  . surgical abortion     Family History  Problem Relation Age of Onset  . Hypertension Mother   . Mental illness Maternal Grandmother   . Diabetes Maternal Grandmother   . Mental illness Paternal Grandfather    Social History  Substance Use Topics  . Smoking status: Current Every Day Smoker    Packs/day: 0.30    Years: 2.00    Types: Cigarettes  . Smokeless tobacco: Never Used  . Alcohol use 1.2 - 1.8 oz/week    2 - 3 Standard drinks or equivalent per week     Comment: rare    OB History    Gravida Para Term Preterm AB Living   2 1 1   1 1    SAB TAB Ectopic Multiple Live Births     1           Review of Systems  Denies: HEADACHE,  ABDOMINAL PAIN, CHEST PAIN, CONGESTION, DYSURIA, SHORTNESS OF BREATH  Allergies  Review of patient's allergies indicates no known allergies.  Home Medications   Prior to Admission medications   Medication Sig Start Date End Date Taking? Authorizing Provider  escitalopram (LEXAPRO) 10 MG tablet Take 10 mg by mouth daily.   Yes Historical Provider, MD  zolpidem (AMBIEN) 10 MG tablet Take 10 mg by mouth at bedtime as needed for sleep.   Yes  Historical Provider, MD  acetaminophen (TYLENOL) 500 MG tablet Take 1,000 mg by mouth every 6 (six) hours as needed.     Historical Provider, MD  HYDROcodone-homatropine (HYCODAN) 5-1.5 MG/5ML syrup Take 5 mLs by mouth every 8 (eight) hours as needed for cough. 04/16/15   Elvina Sidle, MD  oseltamivir (TAMIFLU) 75 MG capsule Take 1 capsule (75 mg total) by mouth 2 (two) times daily. 04/16/15   Elvina Sidle, MD  phenylephrine (SUDAFED PE) 10 MG TABS tablet Take 10 mg by mouth every 4 (four) hours as needed.    Historical Provider, MD   Meds Ordered and Administered this Visit  Medications - No data to display  BP 129/79 (BP Location: Left Arm)   Pulse 88   Temp 98.7 F (37.1 C) (Oral)   Resp 12   LMP 10/13/2015 (Exact Date)   SpO2 100%  No data found.   Physical Exam NURSES NOTES AND VITAL SIGNS REVIEWED. CONSTITUTIONAL: Well developed, well nourished, no acute distress HEENT: normocephalic, atraumatic EYES: Conjunctiva normal NECK:normal ROM, supple, no adenopathy PULMONARY:No respiratory distress, normal effort ABDOMINAL: Soft, ND, NT BS+, No CVAT MUSCULOSKELETAL: Normal ROM of all extremities,  SKIN: warm and dry without rash PSYCHIATRIC: Mood and affect, behavior are normal  Urgent Care Course  Clinical Course    Procedures (including critical care time)  Labs Review Labs Reviewed - No data to display  Imaging Review No results found.   Visual Acuity Review  Right Eye Distance:   Left Eye Distance:   Bilateral Distance:    Right Eye Near:   Left Eye Near:    Bilateral Near:         MDM   1. Gastritis     Patient is reassured that there are no issues that require transfer to higher level of care at this time or additional tests. Patient is advised to continue home symptomatic treatment. Patient is advised that if there are new or worsening symptoms to attend the emergency department, contact primary care provider, or return to UC. Instructions  of care provided discharged home in stable condition.    THIS NOTE WAS GENERATED USING A VOICE RECOGNITION SOFTWARE PROGRAM. ALL REASONABLE EFFORTS  WERE MADE TO PROOFREAD THIS DOCUMENT FOR ACCURACY.  I have verbally reviewed the discharge instructions with the patient. A printed AVS was given to the patient.  All questions were answered prior to discharge.      Tharon AquasFrank C Patrick, PA 10/27/15 2100

## 2016-07-15 ENCOUNTER — Ambulatory Visit (INDEPENDENT_AMBULATORY_CARE_PROVIDER_SITE_OTHER): Payer: BC Managed Care – PPO | Admitting: Urgent Care

## 2016-07-15 ENCOUNTER — Encounter: Payer: Self-pay | Admitting: Urgent Care

## 2016-07-15 VITALS — BP 136/94 | HR 137 | Temp 98.3°F | Resp 18 | Ht 65.0 in | Wt 171.7 lb

## 2016-07-15 DIAGNOSIS — L02416 Cutaneous abscess of left lower limb: Secondary | ICD-10-CM

## 2016-07-15 DIAGNOSIS — L03317 Cellulitis of buttock: Secondary | ICD-10-CM | POA: Diagnosis not present

## 2016-07-15 MED ORDER — SULFAMETHOXAZOLE-TRIMETHOPRIM 800-160 MG PO TABS: 1.0000 | ORAL_TABLET | Freq: Two times a day (BID) | ORAL | 0 refills | Status: DC

## 2016-07-15 NOTE — Progress Notes (Signed)
  MRN: [id number] DOB: 02/28/1980  Subjective:   Lisa Valdez is a 36 y.o. female presenting for chief complaint of Rash (couple spots on bottom and on theigh. Painful and draining.)  Reports 2 week history of right buttock boils (now resolving), 5 day history of worsening, draining inner left thigh boil, 1 day history of left buttock boil. The latter two are painful and have been draining. She has not done anything to the boils apart from put band-aids over them but is unable to tolerate that now. Denies fever, nausea, vomiting, belly pain, chills.   Lisa Valdez has a current medication list which includes the following prescription(s): alprazolam, escitalopram, and zolpidem, and the following Facility-Administered Medications: levonorgestrel. Also has No Known Allergies.  Lisa Valdez  has a past medical history of Anxiety; Depression; HSV-2 (herpes simplex virus 2) infection; and UTI (lower urinary tract infection). Also  has a past surgical history that includes surgical abortion.  Objective:   Vitals: BP (!) 136/94   Pulse (!) 137   Temp 98.3 F (36.8 C) (Oral)   Resp 18   Ht 5\' 5"  (1.651 m)   Wt 171 lb 11.2 oz (77.9 kg)   SpO2 100%   BMI 28.57 kg/m   Physical Exam  Constitutional: She is oriented to person, place, and time. She appears well-developed and well-nourished.  Cardiovascular: Normal rate.   Pulmonary/Chest: Effort normal.  Neurological: She is alert and oriented to person, place, and time.  Skin:     PROCEDURE NOTE: I&D of Abscess Verbal consent obtained. Local anesthesia with 1cc of 1% lidocaine with epinephrine. Site cleansed with alcohol prep pad. Incision of 0.5cm was made using a 11 blade, discharge of ~1cc of pus and serosanguinous fluid. Wound cavity depth was less than 0.5cm. No packing was placed. Cleansed and dressed.   Assessment and Plan :   1. Abscess of left thigh 2. Cellulitis of left buttock - Will have patient start Bactrim given patient's plans to  [place] at the beach (will avoid doxy). Wound culture pending. Wound care reviewed. Recheck in 2 days if no improvement. - Apply warm compresses to buttock area.  Wallis BambergMario Ashleyanne Hemmingway, PA-C [facility] [phone] 07/15/2016  2:14 PM

## 2016-07-16 LAB — HIV ANTIBODY (ROUTINE TESTING W REFLEX): HIV Screen 4th Generation wRfx: NONREACTIVE

## 2016-07-16 LAB — CBC
HEMATOCRIT: 46 % (ref 34.0–46.6)
Hemoglobin: 15.6 g/dL (ref 11.1–15.9)
MCH: 30.4 pg (ref 26.6–33.0)
MCHC: 33.9 g/dL (ref 31.5–35.7)
MCV: 90 fL (ref 79–97)
Platelets: 335 10*3/uL (ref 150–379)
RBC: 5.14 x10E6/uL (ref 3.77–5.28)
RDW: 13.3 % (ref 12.3–15.4)
WBC: 9.7 10*3/uL (ref 3.4–10.8)

## 2016-07-17 LAB — WOUND CULTURE

## 2016-07-19 ENCOUNTER — Ambulatory Visit (INDEPENDENT_AMBULATORY_CARE_PROVIDER_SITE_OTHER): Payer: BC Managed Care – PPO | Admitting: Urgent Care

## 2016-07-19 ENCOUNTER — Encounter: Payer: Self-pay | Admitting: Urgent Care

## 2016-07-19 VITALS — BP 128/87 | HR 101 | Temp 98.3°F | Resp 18 | Ht 63.5 in | Wt 169.4 lb

## 2016-07-19 DIAGNOSIS — L02416 Cutaneous abscess of left lower limb: Secondary | ICD-10-CM

## 2016-07-19 DIAGNOSIS — L0231 Cutaneous abscess of buttock: Secondary | ICD-10-CM

## 2016-07-19 NOTE — Patient Instructions (Addendum)
Keep changing your dressings daily. Finish the antibiotic course completely and return to our clinic if you start to have worsening symptoms.    IF you received an x-ray today, you will receive an invoice from [facility]. Please contact [facility] at [phone] with questions or concerns regarding your invoice.   IF you received labwork today, you will receive an invoice from [facility]. Please contact [facility] at [phone] with questions or concerns regarding your invoice.   Our billing staff will not be able to assist you with questions regarding bills from these companies.  You will be contacted with the lab results as soon as they are available. The fastest way to get your results is to activate your My Chart account. Instructions are located on the last page of this paperwork. If you have not heard from us regarding the results in 2 weeks, please contact this office.

## 2016-07-19 NOTE — Progress Notes (Signed)
    MRN: [id number] DOB: 05/10/1980  Subjective:   Lisa Valdez is a 36 y.o. female presenting for follow up on abscesses. Her left thigh abscess has done well. She is changing dressings daily. She is using warm compresses over her left buttock abscess. Denies fever, n/v, abdominal pain, drainage of pus or bleeding through the dressing.   Lisa Valdez has a current medication list which includes the following prescription(s): alprazolam, escitalopram, sulfamethoxazole-trimethoprim, and zolpidem, and the following Facility-Administered Medications: levonorgestrel. Also has No Known Allergies.  Lisa Valdez  has a past medical history of Anxiety; Depression; HSV-2 (herpes simplex virus 2) infection; and UTI (lower urinary tract infection). Also  has a past surgical history that includes surgical abortion.  Objective:   Vitals: Pulse (!) 101   Temp 98.3 F (36.8 C) (Oral)   Resp 18   Ht 5' 3.5" (1.613 m)   Wt 169 lb 6.4 oz (76.8 kg)   SpO2 100%   BMI 29.54 kg/m   Physical Exam  Constitutional: She is oriented to person, place, and time. She appears well-developed and well-nourished.  Cardiovascular: Normal rate.   Pulmonary/Chest: Effort normal.  Neurological: She is alert and oriented to person, place, and time.  Skin:      Assessment and Plan :   1. Abscess of left thigh - Healing very well. Dressings changed.  2. Abscess of left buttock - Continue dressing changes. We will hold off on additional incision and drainage. Wound culture results reviewed, patient is on the right antibiotic course. Recheck in 1 week.  Wallis BambergMario Maneh Sieben, PA-C [facility] [phone] 07/19/2016 11:51 AM

## 2016-08-11 ENCOUNTER — Encounter: Payer: Self-pay | Admitting: Urgent Care

## 2016-08-11 ENCOUNTER — Ambulatory Visit (INDEPENDENT_AMBULATORY_CARE_PROVIDER_SITE_OTHER): Payer: BC Managed Care – PPO | Admitting: Urgent Care

## 2016-08-11 VITALS — BP 133/92 | HR 121 | Temp 99.1°F | Resp 18 | Ht 63.5 in | Wt 167.6 lb

## 2016-08-11 DIAGNOSIS — L0231 Cutaneous abscess of buttock: Secondary | ICD-10-CM | POA: Diagnosis not present

## 2016-08-11 DIAGNOSIS — L03317 Cellulitis of buttock: Secondary | ICD-10-CM

## 2016-08-11 MED ORDER — CLINDAMYCIN HCL 300 MG PO CAPS: 300.0000 mg | ORAL_CAPSULE | Freq: Three times a day (TID) | ORAL | 0 refills | Status: DC

## 2016-08-11 NOTE — Patient Instructions (Addendum)
Skin Abscess A skin abscess is an infected area on or under your skin that contains a collection of pus and other material. An abscess may also be called a furuncle, carbuncle, or boil. An abscess can occur in or on almost any part of your body. Some abscesses break open (rupture) on their own. Most continue to get worse unless they are treated. The infection can spread deeper into the body and eventually into your blood, which can make you feel ill. Treatment usually involves draining the abscess. What are the causes? An abscess occurs when germs, often bacteria, pass through your skin and cause an infection. This may be caused by:  A scrape or cut on your skin.  A puncture wound through your skin, including a needle injection.  Blocked oil or sweat glands.  Blocked and infected hair follicles.  A cyst that forms beneath your skin (sebaceous cyst) and becomes infected.  What increases the risk? This condition is more likely to develop in people who:  Have a weak body defense system (immune system).  Have diabetes.  Have dry and irritated skin.  Get frequent injections or use illegal IV drugs.  Have a foreign body in a wound, such as a splinter.  Have problems with their lymph system or veins.  What are the signs or symptoms? An abscess may start as a painful, firm bump under the skin. Over time, the abscess may get larger or become softer. Pus may appear at the top of the abscess, causing pressure and pain. It may eventually break through the skin and drain. Other symptoms include:  Redness.  Warmth.  Swelling.  Tenderness.  A sore on the skin.  How is this diagnosed? This condition is diagnosed based on your medical history and a physical exam. A sample of pus may be taken from the abscess to find out what is causing the infection and what antibiotics can be used to treat it. You also may have:  Blood tests to look for signs of infection or spread of an infection to  your blood.  Imaging studies such as ultrasound, CT scan, or MRI if the abscess is deep.  How is this treated? Small abscesses that drain on their own may not need treatment. Treatment for an abscess that does not rupture on its own may include:  Warm compresses applied to the area several times per day.  Incision and drainage. Your health care provider will make an incision to open the abscess and will remove pus and any foreign body or dead tissue. The incision area may be packed with gauze to keep it open for a few days while it heals.  Antibiotic medicines to treat infection. For a severe abscess, you may first get antibiotics through an IV and then change to oral antibiotics.  Follow these instructions at home: Abscess Care  If you have an abscess that has not drained, place a warm, clean, wet washcloth over the abscess several times a day. Do this as told by your health care provider.  Follow instructions from your health care provider about how to take care of your abscess. Make sure you: ? Cover the abscess with a bandage (dressing). ? Change your dressing or gauze as told by your health care provider. ? Wash your hands with soap and water before you change the dressing or gauze. If soap and water are not available, use hand sanitizer.  Check your abscess every day for signs of a worsening infection. Check for: ?   More redness, swelling, or pain. ? More fluid or blood. ? Warmth. ? More pus or a bad smell. Medicines  Take over-the-counter and prescription medicines only as told by your health care provider.  If you were prescribed an antibiotic medicine, take it as told by your health care provider. Do not stop taking the antibiotic even if you start to feel better. General instructions  To avoid spreading the infection: ? Do not share personal care items, towels, or hot tubs with others. ? Avoid making skin contact with other people.  Keep all follow-up visits as told by  your health care provider. This is important. Contact a health care provider if:  You have more redness, swelling, or pain around your abscess.  You have more fluid or blood coming from your abscess.  Your abscess feels warm to the touch.  You have more pus or a bad smell coming from your abscess.  You have a fever.  You have muscle aches.  You have chills or a general ill feeling. Get help right away if:  You have severe pain.  You see red streaks on your skin spreading away from the abscess. This information is not intended to replace advice given to you by your health care provider. Make sure you discuss any questions you have with your health care provider. Document Released: 10/28/2004 Document Revised: 09/14/2015 Document Reviewed: 11/27/2014 Elsevier Interactive Patient Education  2018 Elsevier Inc.     IF you received an x-ray today, you will receive an invoice from [facility]. Please contact [facility] at [phone] with questions or concerns regarding your invoice.   IF you received labwork today, you will receive an invoice from [facility]. Please contact [facility] at [phone] with questions or concerns regarding your invoice.   Our billing staff will not be able to assist you with questions regarding bills from these companies.  You will be contacted with the lab results as soon as they are available. The fastest way to get your results is to activate your My Chart account. Instructions are located on the last page of this paperwork. If you have not heard from us regarding the results in 2 weeks, please contact this office.      

## 2016-08-11 NOTE — Progress Notes (Signed)
  MRN: [id number] DOB: 01/27/1981  Subjective:   Lisa Valdez is a 36 y.o. female presenting for chief complaint of Abscess (R buttock. Red and burning has not come to a head yet. Noticed Sunday night thought it was a pimple. Monday night is when it got really bad.)  Reports 2 day history of abscess over right buttock. There is redness, pain, swelling and hardness. Patient feels as if the infection is deep. 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 She completed this course of antibiotic but unfortunately has had a recurrence in a different location.    Kennon RoundsSally has a current medication list which includes the following prescription(s): alprazolam, escitalopram, and zolpidem, and the following Facility-Administered Medications: levonorgestrel. Also has No Known Allergies.  Kennon RoundsSally  has a past medical history of Anxiety; Depression; HSV-2 (herpes simplex virus 2) infection; and UTI (lower urinary tract infection). Also  has a past surgical history that includes surgical abortion.  Objective:   Vitals: BP (!) 133/92   Pulse (!) 121   Temp 99.1 F (37.3 C) (Oral)   Resp 18   Ht 5' 3.5" (1.613 m)   Wt 167 lb 9.6 oz (76 kg)   SpO2 100%   BMI 29.22 kg/m   Pulse was 95 on recheck by PA-Kaidan Spengler using pulse oximeter at end of her visit.  Physical Exam  Constitutional: She is oriented to person, place, and time. She appears well-developed and well-nourished.  Cardiovascular: Normal rate.   Pulmonary/Chest: Effort normal.  Neurological: She is alert and oriented to person, place, and time.  Skin:       Assessment and Plan :   1. Cellulitis and abscess of buttock - Patient and I agree that we will hold off on incision and drainage given lack of fluctuance. We will use clindamycin based off of the last wound culture, patient is to use warm compresses 3 times daily. Recheck in 2 days.  - clindamycin (CLEOCIN) 300 MG  capsule; Take 1 capsule (300 mg total) by mouth 3 (three) times daily with meals.  Dispense: 30 capsule; Refill: 0   Wallis BambergMario Killian Schwer, PA-C Primary Care at [facility] [phone] 08/11/2016  5:35 PM

## 2016-08-13 ENCOUNTER — Ambulatory Visit (INDEPENDENT_AMBULATORY_CARE_PROVIDER_SITE_OTHER): Payer: BC Managed Care – PPO | Admitting: Physician Assistant

## 2016-08-13 ENCOUNTER — Encounter: Payer: Self-pay | Admitting: Physician Assistant

## 2016-08-13 VITALS — BP 134/82 | HR 106 | Temp 99.4°F | Resp 18 | Ht 63.5 in | Wt 170.4 lb

## 2016-08-13 DIAGNOSIS — L0231 Cutaneous abscess of buttock: Secondary | ICD-10-CM | POA: Diagnosis not present

## 2016-08-13 DIAGNOSIS — L03317 Cellulitis of buttock: Secondary | ICD-10-CM

## 2016-08-13 LAB — POCT CBC
Granulocyte percent: 62.9 %G (ref 37–80)
HEMATOCRIT: 43.6 % (ref 37.7–47.9)
HEMOGLOBIN: 14.4 g/dL (ref 12.2–16.2)
LYMPH, POC: 3 (ref 0.6–3.4)
MCH, POC: 30.3 pg (ref 27–31.2)
MCHC: 33 g/dL (ref 31.8–35.4)
MCV: 91.9 fL (ref 80–97)
MID (cbc): 0.5 (ref 0–0.9)
MPV: 7.2 fL (ref 0–99.8)
POC GRANULOCYTE: 6 (ref 2–6.9)
POC LYMPH %: 31.5 % (ref 10–50)
POC MID %: 5.6 % (ref 0–12)
Platelet Count, POC: 356 10*3/uL (ref 142–424)
RBC: 4.74 M/uL (ref 4.04–5.48)
RDW, POC: 12.9 %
WBC: 9.5 10*3/uL (ref 4.6–10.2)

## 2016-08-13 MED ORDER — CEPHALEXIN 500 MG PO CAPS: 500.0000 mg | ORAL_CAPSULE | Freq: Three times a day (TID) | ORAL | 0 refills | Status: AC

## 2016-08-13 NOTE — Patient Instructions (Addendum)
Start taking keflex three times a day along with clindamycin. Since you are on two antibiotics, make sure you are getting a probiotic whether it is through a tablet, kombucha, or yogurt. Continue warm compresses. Monitor your temperature at home. If you start to develop a worsening fever, chills, nausea, or vomiting, please seek care immediately. Otherwise, plan for follow up on Monday, 08/16/16 for reevaluation. Thank you for letting me participate in your health and well being.   Cellulitis, Adult Cellulitis is a skin infection. The infected area is usually red and sore. This condition occurs most often in the arms and lower legs. It is very important to get treated for this condition. Follow these instructions at home:  Take over-the-counter and prescription medicines only as told by your doctor.  If you were prescribed an antibiotic medicine, take it as told by your doctor. Do not stop taking the antibiotic even if you start to feel better.  Drink enough fluid to keep your pee (urine) clear or pale yellow.  Do not touch or rub the infected area.  Raise (elevate) the infected area above the level of your heart while you are sitting or lying down.  Place warm or cold wet cloths (warm or cold compresses) on the infected area. Do this as told by your doctor.  Keep all follow-up visits as told by your doctor. This is important. These visits let your doctor make sure your infection is not getting worse. Contact a doctor if:  You have a fever.  Your symptoms do not get better after 1-2 days of treatment.  Your bone or joint under the infected area starts to hurt after the skin has healed.  Your infection comes back. This can happen in the same area or another area.  You have a swollen bump in the infected area.  You have new symptoms.  You feel ill and also have muscle aches and pains. Get help right away if:  Your symptoms get worse.  You feel very sleepy.  You throw up (vomit)  or have watery poop (diarrhea) for a long time.  There are red streaks coming from the infected area.  Your red area gets larger.  Your red area turns darker. This information is not intended to replace advice given to you by your health care provider. Make sure you discuss any questions you have with your health care provider. Document Released: 07/07/2007 Document Revised: 06/26/2015 Document Reviewed: 11/27/2014 Elsevier Interactive Patient Education  2018 [employer]  IF you received an x-ray today, you will receive an invoice from [facility]. Please contact [facility] at [phone] with questions or concerns regarding your invoice.   IF you received labwork today, you will receive an invoice from [facility]. Please contact [facility] at [phone] with questions or concerns regarding your invoice.   Our billing staff will not be able to assist you with questions regarding bills from these companies.  You will be contacted with the lab results as soon as they are available. The fastest way to get your results is to activate your My Chart account. Instructions are located on the last page of this paperwork. If you have not heard from us regarding the results in 2 weeks, please contact this office.

## 2016-08-13 NOTE — Progress Notes (Signed)
MRN: [id number] DOB: Jun 18, 1980  Subjective:   Lisa Valdez is a 36 y.o. female presenting for follow up on R buttock abscess. Pt initially seen on 08/11/16 by PA [facility]. Exam revealed a 2 cm area of erythema and induration without fluctuance in the right lower lateral buttocks. Pt was started on clindamycin daily and told to use warm compresses. Instructed to follow up for recheck in 2 days. Today, pt notes she is still having pain and redness in the area. Has been taking medication as prescribed and doing the warm compresses occasionally. Denies fever, chills, nausea, vomiting, and purulent drainage.  Of note, pt has hx of boils. Notes she was previously seen in 07/2016 for abscess I&D, and that wound culture showed MRSA.   Of note, pt is leaving to go to [place] tomorrow for the next two weeks.   Review of Systems  Respiratory: Negative for shortness of breath.   Cardiovascular: Negative for chest pain and palpitations.  Gastrointestinal: Negative for abdominal pain.  Neurological: Negative for dizziness and headaches.     Lisa Valdez has a current medication list which includes the following prescription(s): alprazolam, escitalopram, zolpidem, and clindamycin, and the following Facility-Administered Medications: levonorgestrel. Also has No Known Allergies.  Lisa Valdez  has a past medical history of Anxiety; Depression; HSV-2 (herpes simplex virus 2) infection; and UTI (lower urinary tract infection). Also  has a past surgical history that includes surgical abortion.   Objective:   Vitals: BP 134/82   Pulse (!) 106   Temp 99.4 F (37.4 C) (Oral)   Resp 18   Ht 5' 3.5" (1.613 m)   Wt 170 lb 6.4 oz (77.3 kg)   SpO2 99%   BMI 29.71 kg/m   Physical Exam  Constitutional: She is oriented to person, place, and time. She appears well-developed and well-nourished.  HENT:  Head: Normocephalic and atraumatic.  Eyes: Conjunctivae are normal.  Neck: Normal range of motion.    Pulmonary/Chest: Effort normal.  Neurological: She is alert and oriented to person, place, and time.  Skin: Skin is warm and dry.  ~3 cm erythema and induration noted on right lateral buttocks. No overlying fluctuance. Mild tenderness and warmth noted with palpation. See image below.   Psychiatric: She has a normal mood and affect.  Vitals reviewed.     BP Readings from Last 3 Encounters:  08/13/16 134/82  08/11/16 (!) 133/92  07/19/16 128/87   Pulse Readings from Last 3 Encounters:  08/13/16 (!) 106  08/11/16 (!) 121  07/19/16 (!) 101   Temp Readings from Last 3 Encounters:  08/13/16 99.4 F (37.4 C) (Oral)  08/11/16 99.1 F (37.3 C) (Oral)  07/19/16 98.3 F (36.8 C) (Oral)   Results for orders placed or performed in visit on 08/13/16 (from the past 24 hour(s))  POCT CBC     Status: None   Collection Time: 08/13/16  5:50 PM  Result Value Ref Range   WBC 9.5 4.6 - 10.2 K/uL   Lymph, poc 3.0 0.6 - 3.4   POC LYMPH PERCENT 31.5 10 - 50 %L   MID (cbc) 0.5 0 - 0.9   POC MID % 5.6 0 - 12 %M   POC Granulocyte 6.0 2 - 6.9   Granulocyte percent 62.9 37 - 80 %G   RBC 4.74 4.04 - 5.48 M/uL   Hemoglobin 14.4 12.2 - 16.2 g/dL   HCT, POC 46.9 62.9 - 47.9 %   MCV 91.9 80 - 97 fL   MCH, POC  30.3 27 - 31.2 pg   MCHC 33.0 31.8 - 35.4 g/dL   RDW, POC 16.112.9 %   Platelet Count, POC 356 142 - 424 K/uL   MPV 7.2 0 - 99.8 fL    Assessment and Plan :  1. Cellulitis and abscess of buttock Considering there is no fluctuance, I&D is not warranted. Will add keflex to antibiotic regimen. Continue clindamycin as prescribed. Pt instructed to continue warm compresses. Instructed to take ibuprofen as prescribed for pain and swelling. She will be out of town for the next two weeks so I have given her strict ED precautions. - POCT CBC - cephALEXin (KEFLEX) 500 MG capsule; Take 1 capsule (500 mg total) by mouth 3 (three) times daily.  Dispense: 21 capsule; Refill: 0  Benjiman CoreBrittany Shakeita Vandevander, PA-C   [facility] 08/13/2016 6:39 PM

## 2016-10-30 ENCOUNTER — Ambulatory Visit (INDEPENDENT_AMBULATORY_CARE_PROVIDER_SITE_OTHER): Payer: BC Managed Care – PPO | Admitting: Urgent Care

## 2016-10-30 ENCOUNTER — Encounter: Payer: Self-pay | Admitting: Urgent Care

## 2016-10-30 VITALS — BP 134/88 | HR 120 | Temp 98.1°F | Resp 16 | Ht 63.5 in | Wt 166.2 lb

## 2016-10-30 DIAGNOSIS — Z23 Encounter for immunization: Secondary | ICD-10-CM | POA: Diagnosis not present

## 2016-10-30 DIAGNOSIS — L989 Disorder of the skin and subcutaneous tissue, unspecified: Secondary | ICD-10-CM | POA: Diagnosis not present

## 2016-10-30 DIAGNOSIS — Z8614 Personal history of Methicillin resistant Staphylococcus aureus infection: Secondary | ICD-10-CM

## 2016-10-30 NOTE — Progress Notes (Signed)
   MRN: [id number] DOB: Dec 11, 1980  Subjective:   Lisa Valdez is a 36 y.o. female presenting for chief complaint of rt buttock (bump) (hx of MRSA in same spot, noticed on Wed, painful on thurs/friday, no pain today and bump is smaller)  Reports 4 day history of itchy and mildly painful mass over right buttock. Denies fever, drainage of pus or bleeding. She ended up scratching the mass and it has since improved. However, due to her history of MRSA abscesses she is very concerned and wants to be examined.  Lisa Valdez has a current medication list which includes the following prescription(s): alprazolam, escitalopram, zolpidem, and clindamycin, and the following Facility-Administered Medications: levonorgestrel. Also has No Known Allergies.  Lisa Valdez  has a past medical history of Anxiety; Depression; HSV-2 (herpes simplex virus 2) infection; and UTI (lower urinary tract infection). Also  has a past surgical history that includes surgical abortion.  Objective:   Vitals: BP 134/88 (BP Location: Right Arm, Patient Position: Sitting, Cuff Size: Large)   Pulse (!) 120   Temp 98.1 F (36.7 C) (Oral)   Resp 16   Ht 5' 3.5" (1.613 m)   Wt 166 lb 3.2 oz (75.4 kg)   SpO2 100%   BMI 28.98 kg/m   Physical Exam  Constitutional: She is oriented to person, place, and time. She appears well-developed and well-nourished.  Cardiovascular: Normal rate.   Pulmonary/Chest: Effort normal.  Neurological: She is alert and oriented to person, place, and time.  Skin:      Assessment and Plan :   Skin lesion  History of MRSA infection  Resolving superficial skin infection. Anticipatory guidance provided. Return-to-clinic precautions discussed, patient verbalized understanding.   Wallis Bamberg, PA-C Primary Care at [facility] [phone] 10/30/2016  4:15 PM

## 2016-10-30 NOTE — Patient Instructions (Addendum)
Please use warm compresses over the affected area. Come back to our clinic if you develop redness, pain, drainage of pus. You can take Tylenol and ibuprofen in the meantime. Thank you for letting me take care of you! We are here if you need [place]. =)     IF you received an x-ray today, you will receive an invoice from [facility]. Please contact [facility] at [phone] with questions or concerns regarding your invoice.   IF you received labwork today, you will receive an invoice from [facility]. Please contact [facility] at [phone] with questions or concerns regarding your invoice.   Our billing staff will not be able to assist you with questions regarding bills from these companies.  You will be contacted with the lab results as soon as they are available. The fastest way to get your results is to activate your My Chart account. Instructions are located on the last page of this paperwork. If you have not heard from [place] regarding the results in 2 weeks, please contact this office.

## 2017-02-28 ENCOUNTER — Ambulatory Visit (INDEPENDENT_AMBULATORY_CARE_PROVIDER_SITE_OTHER): Payer: BC Managed Care – PPO | Admitting: Physician Assistant

## 2017-02-28 ENCOUNTER — Ambulatory Visit (INDEPENDENT_AMBULATORY_CARE_PROVIDER_SITE_OTHER): Payer: BC Managed Care – PPO

## 2017-02-28 ENCOUNTER — Encounter: Payer: Self-pay | Admitting: Physician Assistant

## 2017-02-28 ENCOUNTER — Other Ambulatory Visit: Payer: Self-pay

## 2017-02-28 VITALS — BP 143/100 | HR 100 | Temp 99.9°F | Resp 18 | Ht 65.55 in | Wt 167.4 lb

## 2017-02-28 DIAGNOSIS — R05 Cough: Secondary | ICD-10-CM

## 2017-02-28 DIAGNOSIS — J029 Acute pharyngitis, unspecified: Secondary | ICD-10-CM | POA: Diagnosis not present

## 2017-02-28 DIAGNOSIS — R509 Fever, unspecified: Secondary | ICD-10-CM | POA: Diagnosis not present

## 2017-02-28 DIAGNOSIS — R0981 Nasal congestion: Secondary | ICD-10-CM | POA: Diagnosis not present

## 2017-02-28 DIAGNOSIS — R6889 Other general symptoms and signs: Secondary | ICD-10-CM | POA: Diagnosis not present

## 2017-02-28 DIAGNOSIS — R059 Cough, unspecified: Secondary | ICD-10-CM

## 2017-02-28 LAB — POCT INFLUENZA A/B
INFLUENZA A, POC: NEGATIVE
INFLUENZA B, POC: NEGATIVE

## 2017-02-28 MED ORDER — BENZONATATE 100 MG PO CAPS: 100.0000 mg | ORAL_CAPSULE | Freq: Three times a day (TID) | ORAL | 0 refills | Status: DC | PRN

## 2017-02-28 MED ORDER — BENZOCAINE-MENTHOL 15-3.6 MG MT LOZG: 1.0000 | LOZENGE | OROMUCOSAL | 0 refills | Status: DC

## 2017-02-28 MED ORDER — IPRATROPIUM BROMIDE 0.03 % NA SOLN: 2.0000 | Freq: Two times a day (BID) | NASAL | 0 refills | Status: DC

## 2017-02-28 MED ORDER — HYDROCODONE-HOMATROPINE 5-1.5 MG/5ML PO SYRP: 5.0000 mL | ORAL_SOLUTION | Freq: Three times a day (TID) | ORAL | 0 refills | Status: DC | PRN

## 2017-02-28 NOTE — Patient Instructions (Addendum)
Your point of care flu test was negative but your symptoms are suggestive of the flu. Your chest xray was negative, which is good.  I recommend we treat this symptomatically.  I have given you Cepacol lozenges for your sore throat, Atrovent nasal spray for runny nose, Tessalon Perles during the daytime for your cough and cough syrup with codeine for your nighttime cough.  Be aware that cough syrup can cause you to be drowsy so use it cautiously.  Also recommend ibuprofen 600 mg every 8 hours for pain and fever.  You are contagious until you are fever free for 24 hours without using tylenol or ibuprofen.Please stay out of work until you are no longer contagious. Two major complications after the flu are pneumonia and sinus infections. Please be aware of this and if you are not any better in 7-10 days or you develop worsening cough or sinus pressure, seek care at our clinic or the ED. Continue to wash your hands and wear a mask daily especially around other people.    Influenza, Adult Influenza ("the flu") is an infection in the lungs, nose, and throat (respiratory tract). It is caused by a virus. The flu causes many common cold symptoms, as well as a high fever and body aches. It can make you feel very sick. The flu spreads easily from person to person (is contagious). Getting a flu shot (influenza vaccination) every year is the best way to prevent the flu. Follow these instructions at home:  Take over-the-counter and prescription medicines only as told by your doctor.  Use a cool mist humidifier to add moisture (humidity) to the air in your home. This can make it easier to breathe.  Rest as needed.  Drink enough fluid to keep your pee (urine) clear or pale yellow.  Cover your mouth and nose when you cough or sneeze.  Wash your hands with soap and water often, especially after you cough or sneeze. If you cannot use soap and water, use hand sanitizer.  Stay home from work or school as told by  your doctor. Unless you are visiting your doctor, try to avoid leaving home until your fever has been gone for 24 hours without the use of medicine.  Keep all follow-up visits as told by your doctor. This is important. How is this prevented?  Getting a yearly (annual) flu shot is the best way to avoid getting the flu. You may get the flu shot in late summer, fall, or winter. Ask your doctor when you should get your flu shot.  Wash your hands often or use hand sanitizer often.  Avoid contact with people who are sick during cold and flu season.  Eat healthy foods.  Drink plenty of fluids.  Get enough sleep.  Exercise regularly. Contact a doctor if:  You get new symptoms.  You have: ? Chest pain. ? Watery poop (diarrhea). ? A fever.  Your cough gets worse.  You start to have more mucus.  You feel sick to your stomach (nauseous).  You throw up (vomit). Get help right away if:  You start to be short of breath or have trouble breathing.  Your skin or nails turn a bluish color.  You have very bad pain or stiffness in your neck.  You get a sudden headache.  You get sudden pain in your face or ear.  You cannot stop throwing up. This information is not intended to replace advice given to you by your health care provider. Make sure  you discuss any questions you have with your health care provider. Document Released: 10/28/2007 Document Revised: 06/26/2015 Document Reviewed: 11/12/2014 Elsevier Interactive Patient Education  2017 [employer]   IF you received an x-ray today, you will receive an invoice from [facility]. Please contact [facility] at [phone] with questions or concerns regarding your invoice.   IF you received labwork today, you will receive an invoice from [facility]. Please contact [facility] at [phone] with questions or concerns regarding your invoice.   Our billing staff will not be able to assist you with questions regarding  bills from these companies.  You will be contacted with the lab results as soon as they are available. The fastest way to get your results is to activate your My Chart account. Instructions are located on the last page of this paperwork. If you have not heard from us regarding the results in 2 weeks, please contact this office.

## 2017-02-28 NOTE — Progress Notes (Signed)
MRN: [id number] DOB: 07/19/1980  Subjective:   Lisa Valdez Delgreco is a 37 y.o. female presenting for chief complaint of Cough (X 3 days) and Fatigue (lower grade fever) .  Reports 3 day history of sudden onset sore throat, nasal congestion, dry cough, generalized body aches, low grade fever,  and fatigue. Has tried mucinex, tylenol cold and flu with some relief. Denies  sinus pain, ear pain, wheezing, shortness of breath, chest tightness and chest pain, nausea, vomiting, abdominal pain and diarrhea. 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 Denies history of seasonal allergies. Denies history of asthma, COPD, and diabetes. No regualr exposure to young children or elderly. Patient has not had flu shot this season. Former smoker, quit 3 years ago. Denies any other aggravating or relieving factors, no other questions or concerns.  Kennon RoundsSally has a current medication list which includes the following prescription(s): alprazolam, escitalopram, zolpidem, and clindamycin, and the following Facility-Administered Medications: levonorgestrel. Also has No Known Allergies.  Kennon RoundsSally  has a past medical history of Anxiety, Depression, HSV-2 (herpes simplex virus 2) infection, and UTI (lower urinary tract infection). Also  has a past surgical history that includes surgical abortion.   Objective:   Vitals: BP (!) 143/100 (BP Location: Right Arm, Patient Position: Sitting, Cuff Size: Normal)   Pulse 100   Temp 99.9 F (37.7 C) (Oral)   Resp 18   Ht 5' 5.55" (1.665 m)   Wt 167 lb 6.4 oz (75.9 kg)   SpO2 99%   BMI 27.39 kg/m   Physical Exam  Constitutional: She is oriented to person, place, and time. She appears well-developed and well-nourished.  HENT:  Head: Normocephalic and atraumatic.  Right Ear: Tympanic membrane, external ear and ear canal normal.  Left Ear: Tympanic membrane, external ear and ear canal normal.  Nose: Mucosal edema (mild) and rhinorrhea present. Right  sinus exhibits no maxillary sinus tenderness and no frontal sinus tenderness. Left sinus exhibits no maxillary sinus tenderness and no frontal sinus tenderness.  Mouth/Throat: Uvula is midline and mucous membranes are normal. Posterior oropharyngeal erythema present. Tonsils are 1+ on the right. Tonsils are 1+ on the left. No tonsillar exudate.  Eyes: Conjunctivae are normal.  Neck: Normal range of motion.  Cardiovascular: Normal rate, regular rhythm and normal heart sounds.  Pulmonary/Chest: Effort normal. She has no wheezes. She has rhonchi in the right middle field. She has no rales.  Lymphadenopathy:       Head (right side): No submental, no submandibular, no tonsillar, no preauricular, no posterior auricular and no occipital adenopathy present.       Head (left side): No submental, no submandibular, no tonsillar, no preauricular, no posterior auricular and no occipital adenopathy present.    She has no cervical adenopathy.       Right: No supraclavicular adenopathy present.       Left: No supraclavicular adenopathy present.  Neurological: She is alert and oriented to person, place, and time.  Skin: Skin is warm and dry.  Psychiatric: She has a normal mood and affect.  Vitals reviewed.   Results for orders placed or performed in visit on 02/28/17 (from the past 24 hour(s))  POCT Influenza A/B     Status: None   Collection Time: 02/28/17 12:06 PM  Result Value Ref Range   Influenza A, POC Negative Negative   Influenza B, POC Negative Negative   Dg Chest 2 View  Result Date: 02/28/2017 CLINICAL DATA:  Cough  for 3 days.  Fever. EXAM: CHEST  2 VIEW COMPARISON:  None. FINDINGS: The heart size and mediastinal contours are within normal limits. Both lungs are clear. The visualized skeletal structures are unremarkable. IMPRESSION: No active cardiopulmonary disease. Electronically Signed   By: Elige Ko   On: 02/28/2017 12:10    Assessment and Plan :  1. Cough - POCT Influenza A/B - DG  Chest 2 View; Future - benzonatate (TESSALON) 100 MG capsule; Take 1-2 capsules (100-200 mg total) by mouth 3 (three) times daily as needed for cough.  Dispense: 40 capsule; Refill: 0 - HYDROcodone-homatropine (HYCODAN) 5-1.5 MG/5ML syrup; Take 5 mLs by mouth every 8 (eight) hours as needed for cough.  Dispense: 120 mL; Refill: 0 2. Fever, unspecified 3. Sore throat - Benzocaine-Menthol (CEPACOL SORE THROAT) 15-3.6 MG LOZG; Use as directed 1 lozenge in the mouth or throat every 2 (two) hours.  Dispense: 18 each; Refill: 0 4. Nasal congestion - ipratropium (ATROVENT) 0.03 % nasal spray; Place 2 sprays into both nostrils 2 (two) times daily.  Dispense: 30 mL; Refill: 0 5. Flu-like symptoms History and physical exam findings consistent with flu.  Point-of-care flu test negative.  Chest x-ray negative.  She is overall well-appearing, no distress.  Recommend symptomatic treatment at this time.  Educated on potential complications of the flu.  Encouraged to rest, hydrate, use Tylenol or ibuprofen as prescribed for fever. Advised to return to clinic if symptoms worsen, do not improve, or as needed.   Benjiman Core, PA-C  Primary Care at [facility] 02/28/2017 12:14 PM

## 2017-03-17 ENCOUNTER — Encounter (HOSPITAL_COMMUNITY): Payer: Self-pay

## 2017-03-17 ENCOUNTER — Emergency Department (HOSPITAL_COMMUNITY)
Admission: EM | Admit: 2017-03-17 | Discharge: 2017-03-17 | Disposition: A | Discharge status: Home / Self Care | Payer: BC Managed Care – PPO | Attending: Emergency Medicine | Admitting: Emergency Medicine

## 2017-03-17 DIAGNOSIS — Z87891 Personal history of nicotine dependence: Secondary | ICD-10-CM | POA: Diagnosis not present

## 2017-03-17 DIAGNOSIS — Z79899 Other long term (current) drug therapy: Secondary | ICD-10-CM | POA: Diagnosis not present

## 2017-03-17 DIAGNOSIS — I10 Essential (primary) hypertension: Secondary | ICD-10-CM | POA: Diagnosis not present

## 2017-03-17 LAB — I-STAT CHEM 8, ED
BUN: 8 mg/dL (ref 6–20)
Calcium, Ion: 1.16 mmol/L (ref 1.15–1.40)
Chloride: 102 mmol/L (ref 101–111)
Creatinine, Ser: 0.6 mg/dL (ref 0.44–1.00)
Glucose, Bld: 86 mg/dL (ref 65–99)
HCT: 42 % (ref 36.0–46.0)
Hemoglobin: 14.3 g/dL (ref 12.0–15.0)
Potassium: 3.8 mmol/L (ref 3.5–5.1)
Sodium: 138 mmol/L (ref 135–145)
TCO2: 25 mmol/L (ref 22–32)

## 2017-03-17 MED ORDER — HYDROCHLOROTHIAZIDE 12.5 MG PO TABS: 12.5000 mg | ORAL_TABLET | Freq: Every day | ORAL | 0 refills | Status: DC

## 2017-03-17 NOTE — Discharge Instructions (Signed)
Please read attached information. If you experience any new or worsening signs or symptoms please return to the emergency room for evaluation. Please follow-up with your primary care provider or specialist as discussed. Please use medication prescribed only as directed and discontinue taking if you have any concerning signs or symptoms.   °

## 2017-03-17 NOTE — ED Triage Notes (Signed)
Pt reports high bp at dr office last week so she has been checking it periodically at home. PT reports bp has been ~ 150/111 over the last 2 days. PT denies cp, ha, sob.

## 2017-03-17 NOTE — ED Provider Notes (Signed)
[facility] EMERGENCY DEPARTMENT Provider Note   CSN: [id number] Arrival date & time: 03/17/17  1304     History   Chief Complaint Chief Complaint  Patient presents with  . Hypertension    HPI Lisa Valdez is a 37 y.o. female.  HPI    37 year old female presents today with complaints of hypertension.  Patient notes that recently she was seen by her OB/GYN and noted to be slightly hypertensive.  She notes that she has been checking her blood pressure at home and it has been in the 140 range.  She notes she has remained asymptomatic without any chest pain, shortness of breath abdominal pain, neurological deficits or headache.  She notes normal urination.  Patient reports that she contacted her primary care who instructed her to come to the emergency room due to her blood pressure reading.  Patient denies any significant past medical history of hypertension other than recently, does note a family history of hypertension.  She reports she smokes very minimally 2 cigarettes/day off and on over the last 5 years.  She denies any drug use, reports that she does have a high salt diet.  Patient reports she has a history of anxiety and notes that her heart rate is always elevated when in healthcare settings.     Past Medical History:  Diagnosis Date  . Anxiety   . Depression   . HSV-2 (herpes simplex virus 2) infection   . UTI (lower urinary tract infection)     Patient Active Problem List   Diagnosis Date Noted  . Cellulitis and abscess of buttock 08/11/2016  . Unspecified symptom associated with female genital organs 06/13/2012    Past Surgical History:  Procedure Laterality Date  . surgical abortion      OB History    Gravida Para Term Preterm AB Living   2 1 1   1 1    SAB TAB Ectopic Multiple Live Births     1             Home Medications    Prior to Admission medications   Medication Sig Start Date End Date Taking? Authorizing Provider    ALPRAZolam Prudy Feeler) 0.25 MG tablet Take 0.25 mg by mouth at bedtime as needed for anxiety.    [provider]  Benzocaine-Menthol (CEPACOL SORE THROAT) 15-3.6 MG LOZG Use as directed 1 lozenge in the mouth or throat every 2 (two) hours. 02/28/17   Benjiman Core D, PA-C  benzonatate (TESSALON) 100 MG capsule Take 1-2 capsules (100-200 mg total) by mouth 3 (three) times daily as needed for cough. 02/28/17   Benjiman Core D, PA-C  clindamycin (CLEOCIN) 300 MG capsule Take 1 capsule (300 mg total) by mouth 3 (three) times daily with meals. Patient not taking: Reported on 08/13/2016 08/11/16   Wallis Bamberg, PA-C  escitalopram (LEXAPRO) 10 MG tablet Take 20 mg by mouth daily.     [provider]  hydrochlorothiazide (HYDRODIURIL) 12.5 MG tablet Take 1 tablet (12.5 mg total) by mouth daily. 03/17/17   Fremon Zacharia, Tinnie Gens, PA-C  HYDROcodone-homatropine (HYCODAN) 5-1.5 MG/5ML syrup Take 5 mLs by mouth every 8 (eight) hours as needed for cough. 02/28/17   Benjiman Core D, PA-C  ipratropium (ATROVENT) 0.03 % nasal spray Place 2 sprays into both nostrils 2 (two) times daily. 02/28/17   Benjiman Core D, PA-C  zolpidem (AMBIEN) 10 MG tablet Take 10 mg by mouth at bedtime as needed for sleep.    [provider]  Family History Family History  Problem Relation Age of Onset  . Hypertension Mother   . Mental illness Maternal Grandmother   . Diabetes Maternal Grandmother   . Mental illness Paternal Grandfather     Social History Social History   Tobacco Use  . Smoking status: Former Smoker    Packs/day: 0.30    Years: 2.00    Pack years: 0.60    Types: Cigarettes  . Smokeless tobacco: Never Used  Substance Use Topics  . Alcohol use: Yes    Alcohol/week: 1.2 - 1.8 oz    Types: 2 - 3 Standard drinks or equivalent per week    Comment: rare   . Drug use: No     Allergies   Patient has no known allergies.   Review of Systems Review of Systems  All other systems  reviewed and are negative.    Physical Exam Updated Vital Signs BP (!) 152/103 (BP Location: Right Arm)   Pulse (!) 102   Temp 97.7 F (36.5 C) (Oral)   Resp 16   SpO2 100%   Physical Exam  Constitutional: She is oriented to person, place, and time. She appears well-developed and well-nourished.  HENT:  Head: Normocephalic and atraumatic.  Eyes: Conjunctivae are normal. Pupils are equal, round, and reactive to light. Right eye exhibits no discharge. Left eye exhibits no discharge. No scleral icterus.  Neck: Normal range of motion. No JVD present. No tracheal deviation present.  Cardiovascular: Normal rate, regular rhythm, normal heart sounds and intact distal pulses.  Pulmonary/Chest: Breath sounds normal. No stridor. No respiratory distress. She has no wheezes. She has no rales. She exhibits no tenderness.  Neurological: She is alert and oriented to person, place, and time. Coordination normal.  Psychiatric: She has a normal mood and affect. Her behavior is normal. Judgment and thought content normal.  Nursing note and vitals reviewed.   ED Treatments / Results  Labs (all labs ordered are listed, but only abnormal results are displayed) Labs Reviewed  I-STAT CHEM 8, ED    EKG  EKG Interpretation None       Radiology No results found.  Procedures Procedures (including critical care time)  Medications Ordered in ED Medications - No data to display   Initial Impression / Assessment and Plan / ED Course  I have reviewed the triage vital signs and the nursing notes.  Pertinent labs & imaging results that were available during my care of the patient were reviewed by me and considered in my medical decision making (see chart for details).     Final Clinical Impressions(s) / ED Diagnoses   Final diagnoses:  Hypertension, unspecified type   Labs:  I stat chem 8   Imaging:  Consults:  Therapeutics:  Discharge Meds: HCTZ  Assessment/Plan: 37 year old  female presents today with reports of hypertension.  This is asymptomatic.  Patient has had several weeks of hypertension, she has no significant complaints, no signs of endorgan damage no clear etiology.  Patient is slightly tachycardic here, chart review shows she is always tachycardic.  Patient notes that she does have anxiety in the healthcare settings.  I have very low suspicion for any acute etiology for her tachycardia.  Patient will be started on blood pressure medication, she will have close follow-up with outpatient specialist.  She is given strict return precautions, she verbalized understanding and agreement to today's plan.    ED Discharge Orders        Ordered    hydrochlorothiazide (HYDRODIURIL)  12.5 MG tablet  Daily     03/17/17 1656       Eyvonne Mechanic, PA-C 03/17/17 1701    Mancel Bale, MD 03/18/17 1110

## 2017-03-17 NOTE — ED Notes (Signed)
Patient ready for DC. BP still elevated but improved. Patient states understanding of new medication. Also discussed BE FAST for stroke symptoms.

## 2017-03-27 ENCOUNTER — Other Ambulatory Visit: Payer: Self-pay | Admitting: Physician Assistant

## 2017-03-27 DIAGNOSIS — R0981 Nasal congestion: Secondary | ICD-10-CM

## 2017-03-28 NOTE — Telephone Encounter (Signed)
Refill request for Atrovent .03% nasal spray  LOV 02/18/17 with Lisa Valdez  No refills ordered by [name] for this med  [id number] [place], [place]  [address]

## 2017-03-30 ENCOUNTER — Encounter: Payer: Self-pay | Admitting: Cardiology

## 2017-03-30 ENCOUNTER — Ambulatory Visit: Payer: BC Managed Care – PPO | Admitting: Cardiology

## 2017-03-30 VITALS — BP 124/94 | HR 97 | Ht 64.0 in | Wt 168.6 lb

## 2017-03-30 DIAGNOSIS — R000 Tachycardia, unspecified: Secondary | ICD-10-CM | POA: Diagnosis not present

## 2017-03-30 DIAGNOSIS — R0989 Other specified symptoms and signs involving the circulatory and respiratory systems: Secondary | ICD-10-CM

## 2017-03-30 MED ORDER — METOPROLOL SUCCINATE ER 25 MG PO TB24: 25.0000 mg | ORAL_TABLET | Freq: Every day | ORAL | 6 refills | Status: DC

## 2017-03-30 MED ORDER — HYDROCHLOROTHIAZIDE 12.5 MG PO TABS: 12.5000 mg | ORAL_TABLET | Freq: Every day | ORAL | 0 refills | Status: DC | PRN

## 2017-03-30 NOTE — Progress Notes (Signed)
PCP: Candice Camp, MD  Clinic Note: Chief Complaint  Patient presents with  . New Patient (Initial Visit)  . Hypertension    Pt states she has noticed her BP has been elevated when she checks it at home  . Dizziness    slight dizziness at times     HPI: Lisa Valdez is a 37 y.o. female who is being seen today for the evaluation of High BP & High HR/Palpitations at the request of Candice Camp, MD.  Lisa Valdez was referred by her OB/GYN MD after her visit on March 10, 2017 followed by an ER visit on February 14.  --She noted racing heartbeats in the 120s with blood pressures in the 140s over 100s when seeing her gynecologist.  She then contacted her PCP because her home pressure monitor recorded pressures in the 180/100 mmHg range.  Recent Hospitalizations: ER visit February 14 -> blood pressure was in the 140 range, so her PCP told her to go to the hospital/emergency room.  She denied any sensation of headaches or blurred vision.  Noted a high salt diet --Discharged with low-dose HCTZ for asymptomatic hypertension, despite being noted to be tachycardic  Studies Personally Reviewed - (if available, images/films reviewed: From Epic Chart or Care Everywhere)  N/a  Interval History: Lisa Valdez is a very pleasant young woman who is a Buyer, retail of [employer] and transferred out of [place] to [place] to be close to her sister -she currently works as a Chartered loss adjuster..  She is an otherwise healthy young woman who has been trying to lose weight for the last several months.  She has a history of anxiety and has been more anxious of late.  She describes episodes that have been intermittent and not all the time where she will notice her blood pressure is going quite high but also that her heart rate goes quite high.  She says that her heart rate just seems to be pounding quite high and is usually not associated with activity.  She does sometimes get dizzy when these episodes  happen.  Certainly these episodes can happen with stress or anxiety, but they can also happen just when she is doing routine activities.  When they occur she just feels quite anxious and she is occasionally has some headaches.  Unfortunately when she has starts exercising her heart rate goes up and her blood pressure goes up and then she wants to stop.  It makes it harder for her to continue going.    She may feel some discomfort in her chest when her heart was going fast and the pressures were really high but otherwise denies any real anginal symptoms with rest or exertion.  No exertional dyspnea.  She has had some eye redness, no syncope/near syncope or TIAs/amaurosis fugax.  No melena, hematochezia, hematuria, or epstaxis. No claudication.  ROS: A comprehensive was performed. Review of Systems  Constitutional: Positive for weight loss (Trying to work on this). Negative for malaise/fatigue.  HENT: Negative for congestion and sinus pain.   Respiratory: Negative for cough, shortness of breath and wheezing.   Gastrointestinal: Negative for abdominal pain, blood in stool, constipation, heartburn and melena.  Genitourinary: Negative for dysuria, frequency and hematuria.  Musculoskeletal: Negative for back pain.  Neurological: Positive for dizziness (Occasionally when fast heart rates).  Psychiatric/Behavioral: The patient is nervous/anxious.   All other systems reviewed and are negative.  I have reviewed and (if needed) personally updated the patient's problem list, medications, allergies, past medical  and surgical history, social and family history.   Past Medical History:  Diagnosis Date  . Anxiety   . Depression   . HSV-2 (herpes simplex virus 2) infection   . UTI (lower urinary tract infection)     Past Surgical History:  Procedure Laterality Date  . surgical abortion      Current Meds  Medication Sig  . ALPRAZolam (XANAX) 0.25 MG tablet Take 0.25 mg by mouth at bedtime as needed  for anxiety.  Marland Kitchen escitalopram (LEXAPRO) 10 MG tablet Take 20 mg by mouth daily.   . hydrochlorothiazide (HYDRODIURIL) 12.5 MG tablet Take 1 tablet (12.5 mg total) by mouth daily as needed.  . zolpidem (AMBIEN) 10 MG tablet Take 10 mg by mouth at bedtime as needed for sleep.  . [DISCONTINUED] hydrochlorothiazide (HYDRODIURIL) 12.5 MG tablet Take 1 tablet (12.5 mg total) by mouth daily.   Current Facility-Administered Medications for the 03/30/17 encounter (Office Visit) with Marykay Lex, MD  Medication  . levonorgestrel (MIRENA) 20 MCG/24HR IUD    No Known Allergies  Social History   Tobacco Use  . Smoking status: Former Smoker    Packs/day: 0.30    Years: 2.00    Pack years: 0.60    Types: Cigarettes  . Smokeless tobacco: Never Used  Substance Use Topics  . Alcohol use: Yes    Alcohol/week: 1.2 - 1.8 oz    Types: 2 - 3 Standard drinks or equivalent per week    Comment: rare   . 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  Moved to [place] to be near her sister.  She graduated from [employer].      family history includes Diabetes in her maternal grandmother; Hypertension in her mother; Mental illness in her maternal grandmother and paternal grandfather.  Wt Readings from Last 3 Encounters:  03/30/17 168 lb 9.6 oz (76.5 kg)  02/28/17 167 lb 6.4 oz (75.9 kg)  10/30/16 166 lb 3.2 oz (75.4 kg)    PHYSICAL EXAM BP (!) 124/94 (BP Location: Left Arm, Patient Position: Sitting, Cuff Size: Normal)   Pulse 97   Ht 5\' 4"  (1.626 m)   Wt 168 lb 9.6 oz (76.5 kg)   BMI 28.94 kg/m  Physical Exam  Constitutional: She is oriented to person, place, and time. She appears well-developed and well-nourished. No distress.  Pleasant, healthy-appearing.  Well-groomed  HENT:  Head: Normocephalic and atraumatic.  Eyes: Conjunctivae and EOM are normal. Pupils are equal, round, and reactive to light.  Neck: Normal range of motion.  Neck supple. No hepatojugular reflux and no JVD present. Carotid bruit is not present. No tracheal deviation present. No thyromegaly present.  Cardiovascular: Normal rate, regular rhythm, normal heart sounds and normal pulses.  No extrasystoles are present. PMI is not displaced. Exam reveals no gallop and no friction rub.  No murmur heard. Borderline tachycardic; with heart rate this fast, unable to clearly distinguish if there is a midsystolic click.  Pulmonary/Chest: Effort normal and breath sounds normal. No respiratory distress. She has no wheezes. She has no rales.  Abdominal: Soft. Bowel sounds are normal. She exhibits no distension. There is no tenderness.  Musculoskeletal: Normal range of motion. She exhibits no edema or deformity.  Neurological: She is alert and oriented to person, place, and time. No cranial nerve deficit.  Skin: Skin is warm and dry. No erythema.  Psychiatric: She has a normal mood and affect.  Her behavior is normal. Judgment and thought content normal.  Nursing note and vitals reviewed.   Adult ECG Report  Rate: 97;  Rhythm: normal sinus rhythm and Normal axis, intervals and durations.  Otherwise normal EKG.;   Narrative Interpretation: Normal EKG   Other studies Reviewed: Additional studies/ records that were reviewed today include:  Recent Labs:   Lab Results  Component Value Date   CREATININE 0.60 03/17/2017   BUN 8 03/17/2017   NA 138 03/17/2017   K 3.8 03/17/2017   CL 102 03/17/2017   CO2 22 06/13/2012    ASSESSMENT / PLAN: Problem List Items Addressed This Visit    Inappropriate sinus tachycardia    Resting heart rate in the 90s, has been resting heart rate in the 120s at other clinic visits.  With significant tachycardia episodes noted, probably beta-blocker is reasonable.  Also discussed importance of adequate hydration.  We discussed monitoring for tachycardia spells, and she will try to use smart phone application - [employer]ia by [employer]        Relevant Medications   metoprolol succinate (TOPROL XL) 25 MG 24 hr tablet   hydrochlorothiazide (HYDRODIURIL) 12.5 MG tablet   Other Relevant Orders   Metanephrines, urine, 24 hour   EKG 12-Lead (Completed)   Labile hypertension - Primary    She was started on HCTZ for hypertension.  With her having these tachycardia spells, I think we may be better off using a beta-blocker and then using a CTs as a as needed.  Prior to starting any new medications  However, I would like to check urine metanephrines because of the labile nature of her pressures and tachycardia.  Her electrolytes look relatively normal prior to having been on HCTZ therefore hyperaldosteronism is less likely to be a issue.  Plan: Start on low-dose Toprol 25 mg daily.      Relevant Medications   metoprolol succinate (TOPROL XL) 25 MG 24 hr tablet   hydrochlorothiazide (HYDRODIURIL) 12.5 MG tablet   Other Relevant Orders   Metanephrines, urine, 24 hour   EKG 12-Lead (Completed)      Current medicines are reviewed at length with the patient today. (+/- concerns) n/a The following changes have been made: see below  Patient Instructions  Medication instruction  Start toprol xl ( metoprolol er) 25 mg once dailu after you complete th test for urine metanephrines  May use HCTZ ( fluid pill) if blood pressure is greater than 160/100. Recheck blood pressure before taking medication.   LABS 24 HOUR URINE METANEPHRINES   Your physician recommends that you schedule a follow-up appointment in 1 MONTH WITH DR Odies Desa.      Studies Ordered:   Orders Placed This Encounter  Procedures  . Metanephrines, urine, 24 hour  . EKG 12-Lead      Bryan Lemmaavid Taziah Difatta, M.D., M.S. Interventional Cardiologist   Pager # [phone] Phone # [phone] [address] [place], [place] [address]   Thank you for choosing [facility] at [facility]!!

## 2017-03-30 NOTE — Patient Instructions (Signed)
Medication instruction  Start toprol xl ( metoprolol er) 25 mg once dailu after you complete th test for urine metanephrines  May use HCTZ ( fluid pill) if blood pressure is greater than 160/100. Recheck blood pressure before taking medication.   LABS 24 HOUR URINE METANEPHRINES   Your physician recommends that you schedule a follow-up appointment in 1 MONTH WITH DR HARDING.

## 2017-04-01 ENCOUNTER — Encounter: Payer: Self-pay | Admitting: Cardiology

## 2017-04-01 DIAGNOSIS — R0989 Other specified symptoms and signs involving the circulatory and respiratory systems: Secondary | ICD-10-CM | POA: Insufficient documentation

## 2017-04-01 DIAGNOSIS — I4711 Inappropriate sinus tachycardia, so stated: Secondary | ICD-10-CM | POA: Insufficient documentation

## 2017-04-01 DIAGNOSIS — R000 Tachycardia, unspecified: Secondary | ICD-10-CM | POA: Insufficient documentation

## 2017-04-01 NOTE — Assessment & Plan Note (Signed)
Resting heart rate in the 90s, has been resting heart rate in the 120s at other clinic visits.  With significant tachycardia episodes noted, probably beta-blocker is reasonable.  Also discussed importance of adequate hydration.  We discussed monitoring for tachycardia spells, and she will try to use smart phone application - [employer]ardia by [employer]

## 2017-04-01 NOTE — Assessment & Plan Note (Signed)
She was started on HCTZ for hypertension.  With her having these tachycardia spells, I think we may be better off using a beta-blocker and then using a CTs as a as needed.  Prior to starting any new medications  However, I would like to check urine metanephrines because of the labile nature of her pressures and tachycardia.  Her electrolytes look relatively normal prior to having been on HCTZ therefore hyperaldosteronism is less likely to be a issue.  Plan: Start on low-dose Toprol 25 mg daily.

## 2017-04-06 LAB — METANEPHRINES, URINE, 24 HOUR
METANEPH TOTAL UR: 21 ug/L
Metanephrines, 24H Ur: 63 ug/24 hr (ref 45–290)
NORMETANEPHRINE 24H UR: 183 ug/(24.h) (ref 82–500)
NORMETANEPHRINE UR: 61 ug/L

## 2017-04-10 ENCOUNTER — Telehealth: Payer: Self-pay | Admitting: Physician Assistant

## 2017-04-10 DIAGNOSIS — R0981 Nasal congestion: Secondary | ICD-10-CM

## 2017-04-11 NOTE — Telephone Encounter (Signed)
ipratropium refill Last OV: 02/28/17 Last Refill:02/28/17 [facility] PCP: Benjiman CoreBrittany Wiseman PA-C

## 2017-04-11 NOTE — Telephone Encounter (Signed)
Left message for pt to call with symptoms regarding need for nasal spray

## 2017-04-12 NOTE — Telephone Encounter (Signed)
Pt called and states that she does not need this medication anymore disregard the request

## 2017-05-02 ENCOUNTER — Encounter: Payer: Self-pay | Admitting: Cardiology

## 2017-05-02 ENCOUNTER — Ambulatory Visit: Payer: BC Managed Care – PPO | Admitting: Cardiology

## 2017-05-02 DIAGNOSIS — R0989 Other specified symptoms and signs involving the circulatory and respiratory systems: Secondary | ICD-10-CM | POA: Diagnosis not present

## 2017-05-02 DIAGNOSIS — R000 Tachycardia, unspecified: Secondary | ICD-10-CM

## 2017-05-02 MED ORDER — PROPRANOLOL HCL 10 MG PO TABS: 10.0000 mg | ORAL_TABLET | ORAL | 11 refills | Status: DC | PRN

## 2017-05-02 NOTE — Patient Instructions (Addendum)
Medication Instructions:  Propanolol 10 mg as needed for increased heart rate or blood pressure. First time take only 1/2 tablet (total of 5mg ).   Follow-Up: Your physician recommends that you schedule a follow-up appointment in: 6 Weeks with Dr. Herbie BaltimoreHarding   If you need a refill on your cardiac medications before your next appointment, please call your pharmacy.

## 2017-05-02 NOTE — Progress Notes (Signed)
PCP: Candice Camp, MD  Clinic Note: No chief complaint on file.   HPI: Lisa Valdez is a 37 y.o. female with a PMH below who presents today for follow-up of hypertension and inappropriate sinus tachycardia.Lisa Valdez was last seen on March 30, 2017.  We talked about using smart phone application - Kardia by [employer];   We started off by  checking baseline labs and urine metanephrines.  All of these labs were read as normal.  She was therefore started on Toprol 25 mg daily and recommended using HCTZ if her blood pressures over 160/100 mmHg.Marland Kitchen  Recent Hospitalizations: none  Studies Personally Reviewed - (if available, images/films reviewed: From Epic Chart or Care Everywhere)  none  Interval History: Lisa Valdez has been doing notably better with improved BP & resting HR for most of the time, but is still having spells like she is currently having at the time of this visit with HTN & fast HR during which she feels uneasy & jittery almost like stressed out feeling.  She says last week she was with her sister and nieces down at [facility], and she felt relaxed, comfortable had no episodes of fast heart rates except when she was chasing after her nieces.  Today when she was driving to this appointment, she started feeling stressed out, feeling as though she was running late and has had a somewhat stressful day today.  By time she got here her blood pressure was high and her heart rate was high she is feeling her classic symptoms.  Unfortunately, prior to getting EKG, her heart rate was already down into the low 100s, and she was feeling better.  Despite having a fast heart rate and high blood pressure, she did not have any sensation of chest pain or pressure just the uneasy feeling.  Not really even short of breath.  The episode today was not long enough for her to start having a headache or feeling bad, but she says that this was not nearly as dramatic as the episodes before starting the  beta-blocker.  She did feel a little dizzy when she was walking in, but better when she sat down.  Otherwise, from the cardiac review of symptoms standpoint doing very well:  No chest pain or shortness of breath with rest or exertion.  No PND, orthopnea or edema.  No lightheadedness, dizziness, weakness or syncope/near syncope. No TIA/amaurosis fugax symptoms.  No claudication.  ROS: Review of Systems  Constitutional: Positive for weight loss (Continued weight loss, intentional). Negative for chills, diaphoresis, fever and malaise/fatigue.  HENT: Negative for congestion and nosebleeds.   Respiratory: Negative for shortness of breath.   Gastrointestinal: Negative for abdominal pain, blood in stool, constipation, heartburn and melena.  Genitourinary: Negative for hematuria.  Musculoskeletal: Negative for joint pain.  Neurological: Positive for dizziness. Negative for headaches (Less frequent).  Psychiatric/Behavioral: The patient is nervous/anxious.    I have reviewed and (if needed) personally updated the patient's problem list, medications, allergies, past medical and surgical history, social and family history.   Past Medical History:  Diagnosis Date  . Anxiety   . Depression   . HSV-2 (herpes simplex virus 2) infection   . UTI (lower urinary tract infection)     Past Surgical History:  Procedure Laterality Date  . surgical abortion      Current Meds  Medication Sig  . ALPRAZolam (XANAX) 0.25 MG tablet Take 0.25 mg by mouth at bedtime as needed for anxiety.  Marland Kitchen escitalopram (LEXAPRO)  10 MG tablet Take 20 mg by mouth daily.   . hydrochlorothiazide (HYDRODIURIL) 12.5 MG tablet Take 1 tablet (12.5 mg total) by mouth daily as needed.  . metoprolol succinate (TOPROL XL) 25 MG 24 hr tablet Take 1 tablet (25 mg total) by mouth daily.  . phentermine (ADIPEX-P) 37.5 MG tablet TAKE 1 TABLET EVERY MORNINGPRIOR AUTH DENIED  . zolpidem (AMBIEN) 10 MG tablet Take 10 mg by mouth at  bedtime as needed for sleep.   Current Facility-Administered Medications for the 05/02/17 encounter (Office Visit) with Marykay LexHarding, David W, MD  Medication  . levonorgestrel (MIRENA) 20 MCG/24HR IUD    No Known Allergies  Social History   Tobacco Use  . Smoking status: Former Smoker    Packs/day: 0.30    Years: 2.00    Pack years: 0.60    Types: Cigarettes  . Smokeless tobacco: Never Used  Substance Use Topics  . Alcohol use: Yes    Alcohol/week: 1.2 - 1.8 oz    Types: 2 - 3 Standard drinks or equivalent per week    Comment: rare   . 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  [place].  She graduated from [employer].      family history includes Diabetes in her maternal grandmother; Hypertension in her mother; Mental illness in her maternal grandmother and paternal grandfather.  Wt Readings from Last 3 Encounters:  05/02/17 163 lb 3.2 oz (74 kg)  03/30/17 168 lb 9.6 oz (76.5 kg)  02/28/17 167 lb 6.4 oz (75.9 kg)    PHYSICAL EXAM BP (!) 156/101   Pulse (!) 131   Ht 5\' 5"  (1.651 m)   Wt 163 lb 3.2 oz (74 kg)   SpO2 100%   BMI 27.16 kg/m  Physical Exam  Constitutional: She is oriented to person, place, and time. She appears well-developed and well-nourished. No distress.  HENT:  Head: Normocephalic and atraumatic.  Neck: No hepatojugular reflux and no JVD present. Carotid bruit is not present.  Cardiovascular: Regular rhythm, normal heart sounds, intact distal pulses and normal pulses.  No extrasystoles are present. Tachycardia present. PMI is not displaced. Exam reveals no gallop and no friction rub.  No murmur heard. Pulmonary/Chest: Breath sounds normal. No respiratory distress. She has no wheezes. She has no rales.  Musculoskeletal: Normal range of motion. She exhibits no edema.  Neurological: She is alert and oriented to person, place, and time.  Psychiatric: She has a normal  mood and affect. Her behavior is normal. Judgment and thought content normal.  Initially a little bit anxious and jittery, but after a few minutes of talking, she started to relax and her heart rate by time of my exam was less than 100 bpm.  Nursing note and vitals reviewed.    Adult ECG Report N/a  Other studies Reviewed: Additional studies/ records that were reviewed today include:  Recent Labs:  n/a    ASSESSMENT / PLAN: Problem List Items Addressed This Visit    Labile hypertension    Again for the most part, blood pressure been well controlled until the day as far she can tell.  We had continued HCTZ and started Toprol. Plan: Using as needed propranolol.      Relevant Medications   propranolol (INDERAL) 10 MG tablet   Inappropriate sinus tachycardia    Pretty well controlled with Toprol, at this point I do  not think she needs additional beta-blocker for standing medication, we would provide as needed propranolol which she was start with 10 mg tablets taking 1/2 tablet as needed.  If that does not work to take additional half.  Also reiterated using Kardia by [employer] -smart phone application for event monitoring.      Relevant Medications   propranolol (INDERAL) 10 MG tablet       I spent a total of 25 minutes with the patient and chart review. >  50% of the time was spent in direct patient consultation.   Current medicines are reviewed at length with the patient today.  (+/- concerns) still having some breakthrough episodes The following changes have been made:  See below  Patient Instructions  Medication Instructions:  Propanolol 10 mg as needed for increased heart rate or blood pressure. First time take only 1/2 tablet (total of 5mg ).   Follow-Up: Your physician recommends that you schedule a follow-up appointment in: 6 Weeks with Dr. Herbie Baltimore   If you need a refill on your cardiac medications before your next appointment, please call your  pharmacy.      Studies Ordered:   No orders of the defined types were placed in this encounter.     Bryan Lemma, M.D., M.S. Interventional Cardiologist   Pager # [phone] Phone # [phone] [address] [place], [place] [address]   Thank you for choosing [facility] at [facility]!!

## 2017-05-04 NOTE — Assessment & Plan Note (Signed)
Pretty well controlled with Toprol, at this point I do not think she needs additional beta-blocker for standing medication, we would provide as needed propranolol which she was start with 10 mg tablets taking 1/2 tablet as needed.  If that does not work to take additional half.  Also reiterated using [employer]ardia by [employer] -smart phone application for event monitoring.

## 2017-05-04 NOTE — Assessment & Plan Note (Signed)
Again for the most part, blood pressure been well controlled until the day as far she can tell.  We had continued HCTZ and started Toprol. Plan: Using as needed propranolol.

## 2017-06-09 ENCOUNTER — Ambulatory Visit: Payer: BC Managed Care – PPO | Admitting: Cardiology

## 2017-07-07 ENCOUNTER — Ambulatory Visit: Payer: BC Managed Care – PPO | Admitting: Cardiology

## 2017-12-01 ENCOUNTER — Other Ambulatory Visit: Payer: Self-pay | Admitting: Cardiology

## 2017-12-01 NOTE — Telephone Encounter (Signed)
Rx has been sent to the pharmacy electronically. ° °

## 2018-01-08 NOTE — Progress Notes (Signed)
Cardiology Office Note   Date:  01/09/2018   ID:  Lisa Valdez, DOB 10-24-1980, MRN [id number]  PCP:  Candice Camp, MD  Cardiologist:  Dr. Herbie Baltimore Chief Complaint  Patient presents with  . Follow-up     History of Present Illness: Lisa Valdez is a 37 y.o. female who presents for ongoing assessment and management of hypertension and inappropriate sinus tachycardia.  The patient was seen last by Dr. Herbie Baltimore on 05/02/2017 started on metoprolol 25 mg daily and using HCTZ if blood pressures were greater than 160/90.  The patient is symptomatic with rapid heart rhythm feeling uneasy and jittery.  Feels like she is having a "stressed out feeling." She was started on as needed propanolol 10 mg for rapid heart rhythm.  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 She was seen by school nurse who checked the BP and found that her HR was > 100 bpm. She was sent home and rested. She did take a Xanax as well when she got home. BP settled to normal after about 6 hours. She did not have any HCTZ, or propanolol, as she had run out.   She has not had any further recurrences of this episode. She states this is the first episode is almost 6 months. She denies excessive caffeine or stress, no OTC cold medications.   Past Medical History:  Diagnosis Date  . Anxiety   . Depression   . HSV-2 (herpes simplex virus 2) infection   . UTI (lower urinary tract infection)     Past Surgical History:  Procedure Laterality Date  . surgical abortion       Current Outpatient Medications  Medication Sig Dispense Refill  . ALPRAZolam (XANAX) 0.25 MG tablet Take 0.25 mg by mouth at bedtime as needed for anxiety.    Marland Kitchen escitalopram (LEXAPRO) 10 MG tablet Take 20 mg by mouth daily.     . hydrochlorothiazide (HYDRODIURIL) 12.5 MG tablet Take 1 tablet (12.5 mg total) by mouth daily as needed. 90 tablet 3  . metoprolol succinate (TOPROL-XL) 25  MG 24 hr tablet Take 1 tablet (25 mg total) by mouth daily. 90 tablet 3  . phentermine (ADIPEX-P) 37.5 MG tablet TAKE 1 TABLET EVERY MORNINGPRIOR AUTH DENIED  2  . propranolol (INDERAL) 10 MG tablet Take 1 tablet (10 mg total) by mouth as needed. For increased Heart Rate or Blood Pressure 15 tablet 11  . zolpidem (AMBIEN) 10 MG tablet Take 10 mg by mouth at bedtime as needed for sleep.     Current Facility-Administered Medications  Medication Dose Route Frequency Provider Last Rate Last Dose  . levonorgestrel (MIRENA) 20 MCG/24HR IUD   Intrauterine Once Antionette Char, MD        Allergies:   Patient has no known allergies.    Social History:  The patient  reports that she has quit smoking. Her smoking use included cigarettes. She has a 0.60 pack-year smoking history. She has never used smokeless tobacco. She reports that she drinks about 2.0 - 3.0 standard drinks of alcohol per week. She reports that she does not use drugs.   Family History:  The patient's family history includes Diabetes in her maternal grandmother; Hypertension in her mother; Mental illness in her maternal grandmother and paternal grandfather.    ROS: All other systems are reviewed and negative. Unless otherwise mentioned in H&P    PHYSICAL EXAM: VS:  There were no  vitals taken for this visit. , BMI There is no height or weight on file to calculate BMI. GEN: Well nourished, well developed, in no acute distress HEENT: normal Neck: no JVD, carotid bruits, or masses Cardiac: RRR; no murmurs, rubs, or gallops,no edema  Respiratory:  Clear to auscultation bilaterally, normal work of breathing GI: soft, nontender, nondistended, + BS MS: no deformity or atrophy Skin: warm and dry, no rash Neuro:  Strength and sensation are intact Psych: euthymic mood, full affect   EKG:  NSR with occasional sinus arrhythmia. Rate of  88 bpm.   Recent Labs: 03/17/2017: BUN 8; Creatinine, Ser 0.60; Hemoglobin 14.3; Potassium 3.8;  Sodium 138    Lipid Panel No results found for: CHOL, TRIG, HDL, CHOLHDL, VLDL, LDLCALC, LDLDIRECT    Wt Readings from Last 3 Encounters:  05/02/17 163 lb 3.2 oz (74 kg)  03/30/17 168 lb 9.6 oz (76.5 kg)  02/28/17 167 lb 6.4 oz (75.9 kg)      Other studies Reviewed: None   ASSESSMENT AND PLAN:  1. Hypertension: BP is currently well controlled She will be given refills on HCTZ, metoprolol, and propanolol (to use prn). I will check a BMET and Mg.   2. Palpitations: No further recurrence since most recent episode. She will continue current regimen. Be mindful of caffeine. Labs as above. If occurs again, will order echo and possible ischemic testing.    Current medicines are reviewed at length with the patient today.    Labs/ tests ordered today include: BMET and Mg.  Bettey MareKathryn M. Liborio NixonLawrence DNP, ANP, AACC   01/09/2018 10:21 AM    [facility] [address] Office [phone] Fax [phone]

## 2018-01-09 ENCOUNTER — Encounter: Payer: Self-pay | Admitting: Adult Health

## 2018-01-09 ENCOUNTER — Ambulatory Visit: Payer: BC Managed Care – PPO | Admitting: Adult Health

## 2018-01-09 DIAGNOSIS — I10 Essential (primary) hypertension: Secondary | ICD-10-CM

## 2018-01-09 DIAGNOSIS — Z79899 Other long term (current) drug therapy: Secondary | ICD-10-CM | POA: Diagnosis not present

## 2018-01-09 DIAGNOSIS — R002 Palpitations: Secondary | ICD-10-CM | POA: Diagnosis not present

## 2018-01-09 DIAGNOSIS — R000 Tachycardia, unspecified: Secondary | ICD-10-CM | POA: Diagnosis not present

## 2018-01-09 LAB — BASIC METABOLIC PANEL
BUN/Creatinine Ratio: 18 (ref 9–23)
BUN: 11 mg/dL (ref 6–20)
CALCIUM: 8.5 mg/dL — AB (ref 8.7–10.2)
CHLORIDE: 106 mmol/L (ref 96–106)
CO2: 20 mmol/L (ref 20–29)
Creatinine, Ser: 0.62 mg/dL (ref 0.57–1.00)
GFR calc non Af Amer: 116 mL/min/{1.73_m2} (ref >59)
GFR, EST AFRICAN AMERICAN: 134 mL/min/{1.73_m2} (ref >59)
GLUCOSE: 79 mg/dL (ref 65–99)
POTASSIUM: 4.8 mmol/L (ref 3.5–5.2)
Sodium: 139 mmol/L (ref 134–144)

## 2018-01-09 LAB — MAGNESIUM: Magnesium: 2.1 mg/dL (ref 1.6–2.3)

## 2018-01-09 MED ORDER — HYDROCHLOROTHIAZIDE 12.5 MG PO TABS: 12.5000 mg | ORAL_TABLET | Freq: Every day | ORAL | 3 refills | Status: DC | PRN

## 2018-01-09 MED ORDER — METOPROLOL SUCCINATE ER 25 MG PO TB24: 25.0000 mg | ORAL_TABLET | Freq: Every day | ORAL | 3 refills | Status: AC

## 2018-01-09 NOTE — Patient Instructions (Signed)
Labwork: BMET AND MAG TODAY HERE IN OUR OFFICE AT [facility]  Take the provided lab slips with you to the lab for your blood draw.  If you have labs (blood work) drawn today and your tests are completely normal, you will receive your results only by: Marland Kitchen. MyChart Message (if you have MyChart) OR . A paper copy in the mail If you have any lab test that is abnormal or we need to change your treatment, we will call you to review the results.  Follow-Up: You will need a follow up appointment in 6 MONTHS.  Please call our office 2 months in advance(APRIL 2020) to schedule the (June 2020) appointment.  You may see  DR Selmer DominionHARDING,  Kathryn Lawrence, DNP, AACC or one of the following Advanced Practice Providers on your designated Care Team:    . Theodore DemarkRhonda Barrett, PA-C  -OR- Joni ReiningKathryn Lawrence, DNP, ANP  At [facility], you and your health needs are our priority.  As part of our continuing mission to provide you with exceptional heart care, we have created designated Provider Care Teams.  These Care Teams include your primary Cardiologist (physician) and Advanced Practice Providers (APPs -  Physician Assistants and Nurse Practitioners) who all work together to provide you with the care you need, when you need it.  Thank you for choosing [facility] at [facility]!!

## 2018-05-03 ENCOUNTER — Other Ambulatory Visit: Payer: Self-pay

## 2018-05-03 ENCOUNTER — Other Ambulatory Visit: Payer: Self-pay | Admitting: Adult Health

## 2018-05-03 MED ORDER — HYDROCHLOROTHIAZIDE 12.5 MG PO TABS: 12.5000 mg | ORAL_TABLET | Freq: Every day | ORAL | 3 refills | Status: DC | PRN

## 2018-05-04 NOTE — Telephone Encounter (Signed)
Refilled HCTZ 12.5 MG.

## 2018-10-11 ENCOUNTER — Encounter: Payer: Self-pay | Admitting: Cardiology

## 2018-10-11 ENCOUNTER — Telehealth: Payer: Self-pay | Admitting: *Deleted

## 2018-10-11 ENCOUNTER — Telehealth (INDEPENDENT_AMBULATORY_CARE_PROVIDER_SITE_OTHER): Payer: BC Managed Care – PPO | Admitting: Cardiology

## 2018-10-11 VITALS — Ht 64.0 in | Wt 168.0 lb

## 2018-10-11 DIAGNOSIS — R0989 Other specified symptoms and signs involving the circulatory and respiratory systems: Secondary | ICD-10-CM | POA: Diagnosis not present

## 2018-10-11 DIAGNOSIS — R000 Tachycardia, unspecified: Secondary | ICD-10-CM

## 2018-10-11 MED ORDER — PROPRANOLOL HCL 10 MG PO TABS: 10.0000 mg | ORAL_TABLET | ORAL | 11 refills | Status: AC | PRN

## 2018-10-11 MED ORDER — HYDROCHLOROTHIAZIDE 12.5 MG PO TABS: 12.5000 mg | ORAL_TABLET | Freq: Every day | ORAL | 3 refills | Status: AC | PRN

## 2018-10-11 NOTE — Telephone Encounter (Signed)
Left message on secure voice mail to  PATIENT. INSTRUCTION WAS GIVEN FROM TODAY'S VIRTUAL VISIT.  AVS SUMMARY IS MAILED.  any question may call back

## 2018-10-11 NOTE — Progress Notes (Signed)
Virtual Visit via Video Note   This visit type was conducted due to national recommendations for restrictions regarding the COVID-19 Pandemic (e.g. social distancing) in an effort to limit this patient's exposure and mitigate transmission in our community.  Due to her co-morbid illnesses, this patient is at least at moderate risk for complications without adequate follow up.  This format is felt to be most appropriate for this patient at this time.  All issues noted in this document were discussed and addressed.  A limited physical exam was performed with this format.  Please refer to the patient's chart for her consent to telehealth for [facility].   Patient has given verbal permission to conduct this visit via virtual appointment and to bill insurance 10/11/2018 9:34 AM     Evaluation Performed:  Follow-up visit  Date:  10/11/2018   ID:  Lisa Valdez, DOB 1980/02/18, MRN [id number]  Patient Location: Other:  Work Provider Location: Home  PCP:  Candice Camp, MD  Cardiologist:  Bryan Lemma, MD Electrophysiologist:  None   Chief Complaint:  Follow-up - palpitations/ inappropriate S Tachy  History of Present Illness:    Lisa Valdez is a 38 y.o. female with PMH notable for ~ HTN & rapid HR (inappropriate Sinus Tachycardia) along with anxiety who presents via audio/video conferencing for a telehealth visit today.  On initial consultation in Late Feb, 2019, she describes episodes that have been intermittent and not all the time where she will notice her blood pressure is going quite high but also that her heart rate goes quite high.  She says that her heart rate just seems to be pounding quite high and is usually not associated with activity.  She does sometimes get dizzy when these episodes happen.  Certainly these episodes can happen with stress or anxiety, but they can also happen just when she is doing routine activities.  When they occur she just feels quite anxious and she is  occasionally has some headaches.  Unfortunately when she has starts exercising her heart rate goes up and her blood pressure goes up and then she wants to stop.  It makes it harder for her to continue going.   -- recommended using Kardia smart phone app.; checked urine metanephrines (normal), then started Toprol.  April 2020 --> added PRN Propranolol for "breakthrough" episodes. Usually when stressed. Better if relaxed.   Lisa Valdez was last seen by Joni Reining, NP in 02-17-18--> noted a pronounced episodes of rapid HR & "feeling stressed out", was sent home & took Xanax (had run out of Propranolol) - refilled meds.  Interval History:   She is doing quite well - has not had to use PRN Propranolol.   BP has been well controlled - no PRN use of HCTZ.    No fatigue or DOE.  Feels good. Mother just moved to [place] last week - in time for new Nephew being born. 1st boy.   Cardiovascular ROS: no chest pain or dyspnea on exertion negative for - edema, irregular heartbeat, loss of consciousness, orthopnea, palpitations, paroxysmal nocturnal dyspnea, rapid heart rate, shortness of breath or Syncope/near syncope, TIA/amaurosis fugax.  The patient does not have symptoms concerning for COVID-19 infection (fever, chills, cough, or new shortness of breath).  The patient is practicing social distancing.  ROS:  Please see the history of present illness.    ROS  Past Medical History:  Diagnosis Date  . Anxiety   . Depression   . HSV-2 (herpes simplex  virus 2) infection   . UTI (lower urinary tract infection)    Past Surgical History:  Procedure Laterality Date  . surgical abortion       Current Meds  Medication Sig  . escitalopram (LEXAPRO) 10 MG tablet Take 20 mg by mouth daily.   . metoprolol succinate (TOPROL-XL) 25 MG 24 hr tablet Take 1 tablet (25 mg total) by mouth daily.   Current Facility-Administered Medications for the 10/11/18 encounter (Telemedicine) with Lisa Valdez, David  W, MD  Medication  . levonorgestrel (MIRENA) 20 MCG/24HR IUD     Allergies:   Patient has no known allergies.   Social History   Tobacco Use  . Smoking status: Former Smoker    Packs/day: 0.30    Years: 2.00    Pack years: 0.60    Types: Cigarettes  . Smokeless tobacco: Never Used  Substance Use Topics  . Alcohol use: Yes    Alcohol/week: 2.0 - 3.0 standard drinks    Types: 2 - 3 Standard drinks or equivalent per week    Comment: rare   . Drug use: No     Family Hx: The patient's family history includes Diabetes in her maternal grandmother; Hypertension in her mother; Mental illness in her maternal grandmother and paternal grandfather.   Prior CV studies:   The following studies were reviewed today: . none:  Labs/Other Tests and Data Reviewed:    EKG:  No ECG reviewed.  Recent Labs: 01/09/2018: BUN 11; Creatinine, Ser 0.62; Magnesium 2.1; Potassium 4.8; Sodium 139   Recent Lipid Panel No results found for: CHOL, TRIG, HDL, CHOLHDL, LDLCALC, LDLDIRECT  Wt Readings from Last 3 Encounters:  10/11/18 168 lb (76.2 kg)  05/02/17 163 lb 3.2 oz (74 kg)  03/30/17 168 lb 9.6 oz (76.5 kg)     Objective:    Vital Signs:  Ht 5\' 4"  (1.626 m)   Wt 168 lb (76.2 kg)   BMI 28.84 kg/m   VITAL SIGNS:  reviewed GEN:  Well nourished, well developed female in no acute distress. RESPIRATORY:  normal respiratory effort, symmetric expansion CARDIOVASCULAR:  no peripheral edema NEURO:  alert and oriented x 3, no obvious focal deficit PSYCH:  normal affect   ASSESSMENT & PLAN:    Problem List Items Addressed This Visit    Inappropriate sinus tachycardia - Primary (Chronic)    Well-controlled now on Toprol.  Has not had to use any PRN doses of propranolol or Xanax.  Continue to monitor.  Refill Toprol and PRN propranolol      Relevant Medications   hydrochlorothiazide (HYDRODIURIL) 12.5 MG tablet   propranolol (INDERAL) 10 MG tablet   Labile hypertension (Chronic)     Although I do not have a current blood pressure recorded, she tells me that most days when she checks her blood pressure either at work or at home, her pressures have been well controlled and she has not had to use any of the PRN HCTZ or propranolol.  Continue Toprol      Relevant Medications   hydrochlorothiazide (HYDRODIURIL) 12.5 MG tablet   propranolol (INDERAL) 10 MG tablet      COVID-19 Education: The signs and symptoms of COVID-19 were discussed with the patient and how to seek care for testing (follow up with PCP or arrange E-visit).   The importance of social distancing was discussed today.  Time:   Today, I have spent 10 minutes with the patient with telehealth technology discussing the above problems.  5-10 min charting.  Medication Adjustments/Labs and Tests Ordered: Current medicines are reviewed at length with the patient today.  Concerns regarding medicines are outlined above.   Patient Instructions  Medication Instructions:    Continue taking the Metoprolol Succinate 25 mg daily -- we will refill this along with the as needed Propranolol & HCTZ to take for breakthrough fast heart rates & / or high BP.   If you need a refill on your cardiac medications before your next appointment, please call your pharmacy.   Lab work: None  Testing/Procedures: None   Follow-Up: At [facility], you and your health needs are our priority.  As part of our continuing mission to provide you with exceptional heart care, we have created designated Provider Care Teams.  These Care Teams include your primary Cardiologist (physician) and Advanced Practice Providers (APPs -  Physician Assistants and Nurse Practitioners) who all work together to provide you with the care you need, when you need it. . You will need a follow up appointment in  12 months Sept 2021.  Please call our office 2 months in advance to schedule this appointment.  You may see Glenetta Hew, MD or one of the  following Advanced Practice Providers on your designated Care Team:   . Rosaria Ferries, PA-C . Jory Sims, DNP, ANP  Any Other Special Instructions Will Be Listed Below (If Applicable). n/a    Signed, Glenetta Hew, MD  10/11/2018 9:34 AM    [place]

## 2018-10-11 NOTE — Patient Instructions (Addendum)
Medication Instructions:    Continue taking the Metoprolol Succinate 25 mg daily -- we will refill this along with the as needed Propranolol & HCTZ to take for breakthrough fast heart rates & / or high BP.   If you need a refill on your cardiac medications before your next appointment, please call your pharmacy.   Lab work: None  Testing/Procedures: None   Follow-Up: At [facility], you and your health needs are our priority.  As part of our continuing mission to provide you with exceptional heart care, we have created designated Provider Care Teams.  These Care Teams include your primary Cardiologist (physician) and Advanced Practice Providers (APPs -  Physician Assistants and Nurse Practitioners) who all work together to provide you with the care you need, when you need it. . You will need a follow up appointment in  12 months Sept 2021.  Please call our office 2 months in advance to schedule this appointment.  You may see Glenetta Hew, MD or one of the following Advanced Practice Providers on your designated Care Team:   . Rosaria Ferries, PA-C . Jory Sims, DNP, ANP  Any Other Special Instructions Will Be Listed Below (If Applicable). n/a

## 2018-10-11 NOTE — Assessment & Plan Note (Signed)
Although I do not have a current blood pressure recorded, she tells me that most days when she checks her blood pressure either at work or at home, her pressures have been well controlled and she has not had to use any of the PRN HCTZ or propranolol.  Continue Toprol

## 2018-10-11 NOTE — Assessment & Plan Note (Addendum)
Well-controlled now on Toprol.  Has not had to use any PRN doses of propranolol or Xanax.  Continue to monitor.  Refill Toprol and PRN propranolol

## 2018-11-28 ENCOUNTER — Other Ambulatory Visit: Payer: Self-pay

## 2018-11-28 DIAGNOSIS — Z20822 Contact with and (suspected) exposure to covid-19: Secondary | ICD-10-CM

## 2018-11-30 LAB — NOVEL CORONAVIRUS, NAA: SARS-CoV-2, NAA: NOT DETECTED

## 2018-12-11 IMAGING — DX DG CHEST 2V
2 series · 2 of 2 positions shown · non-contrast
Comparison: None.

CLINICAL DATA: Cough for 3 days.  Fever.

EXAM:
CHEST  2 VIEW

[chest pa]
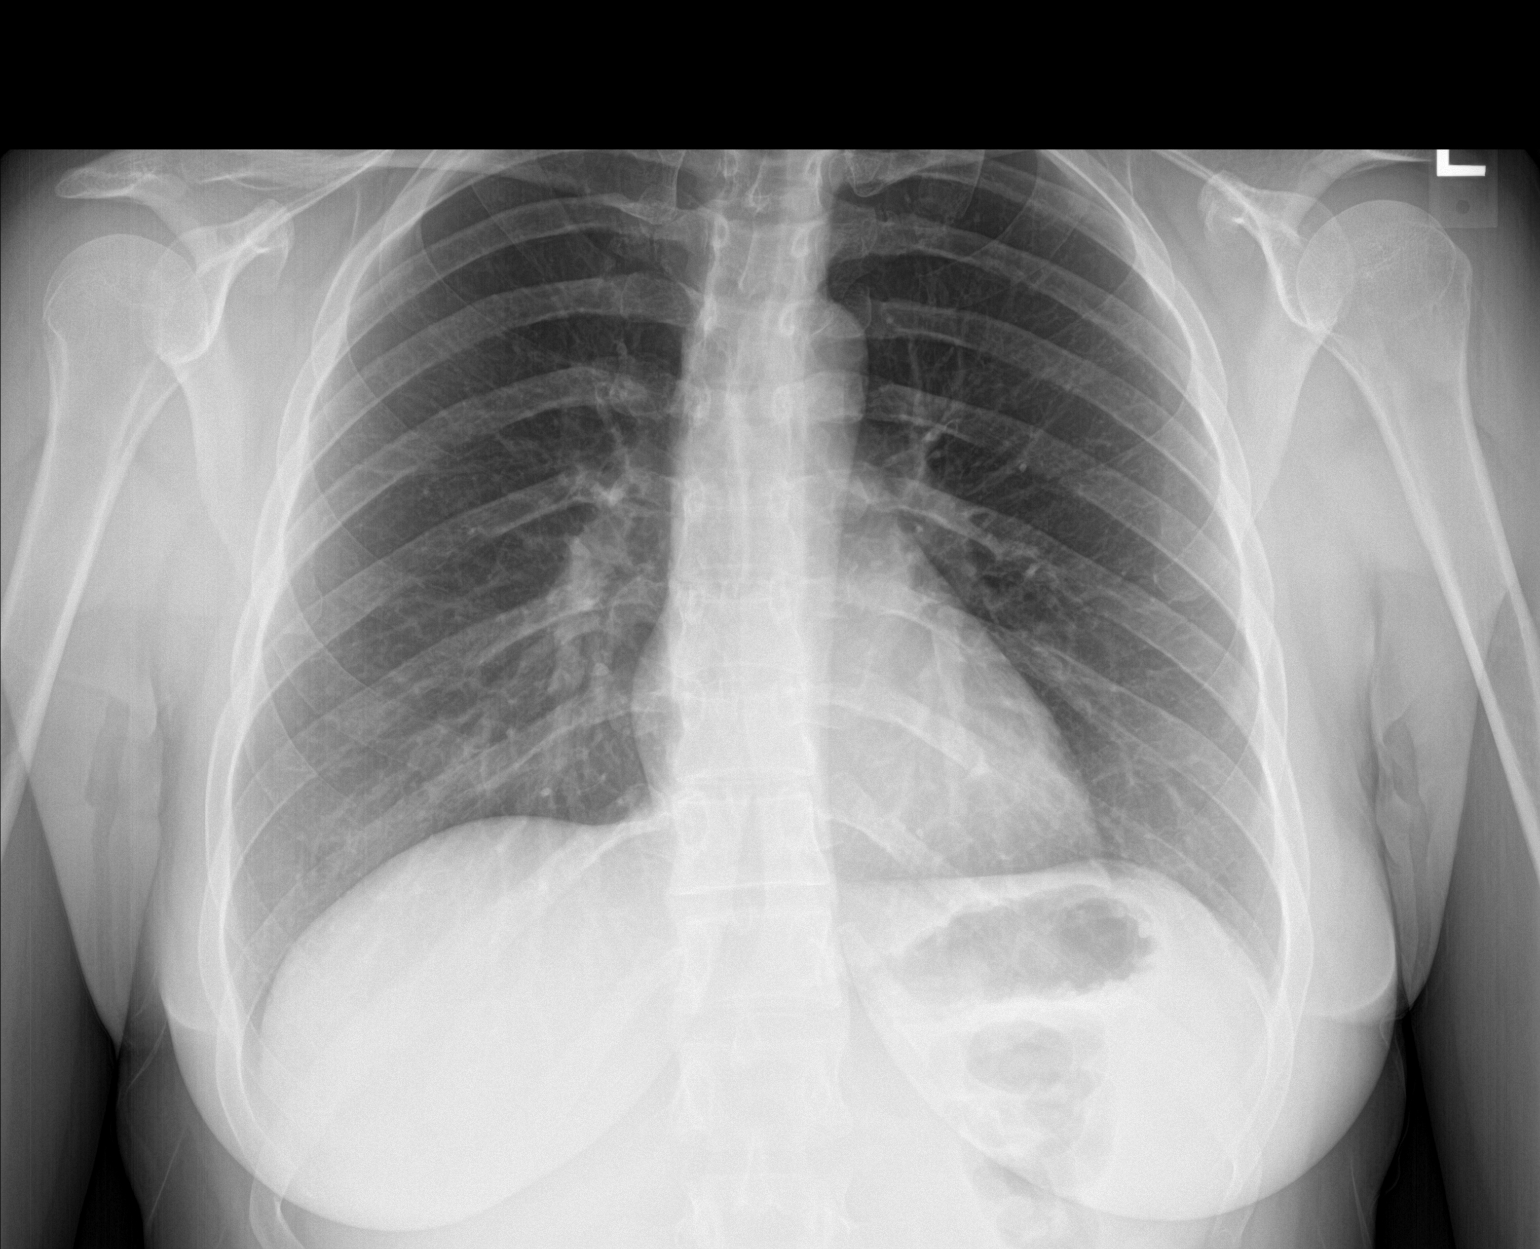

[chest lat]
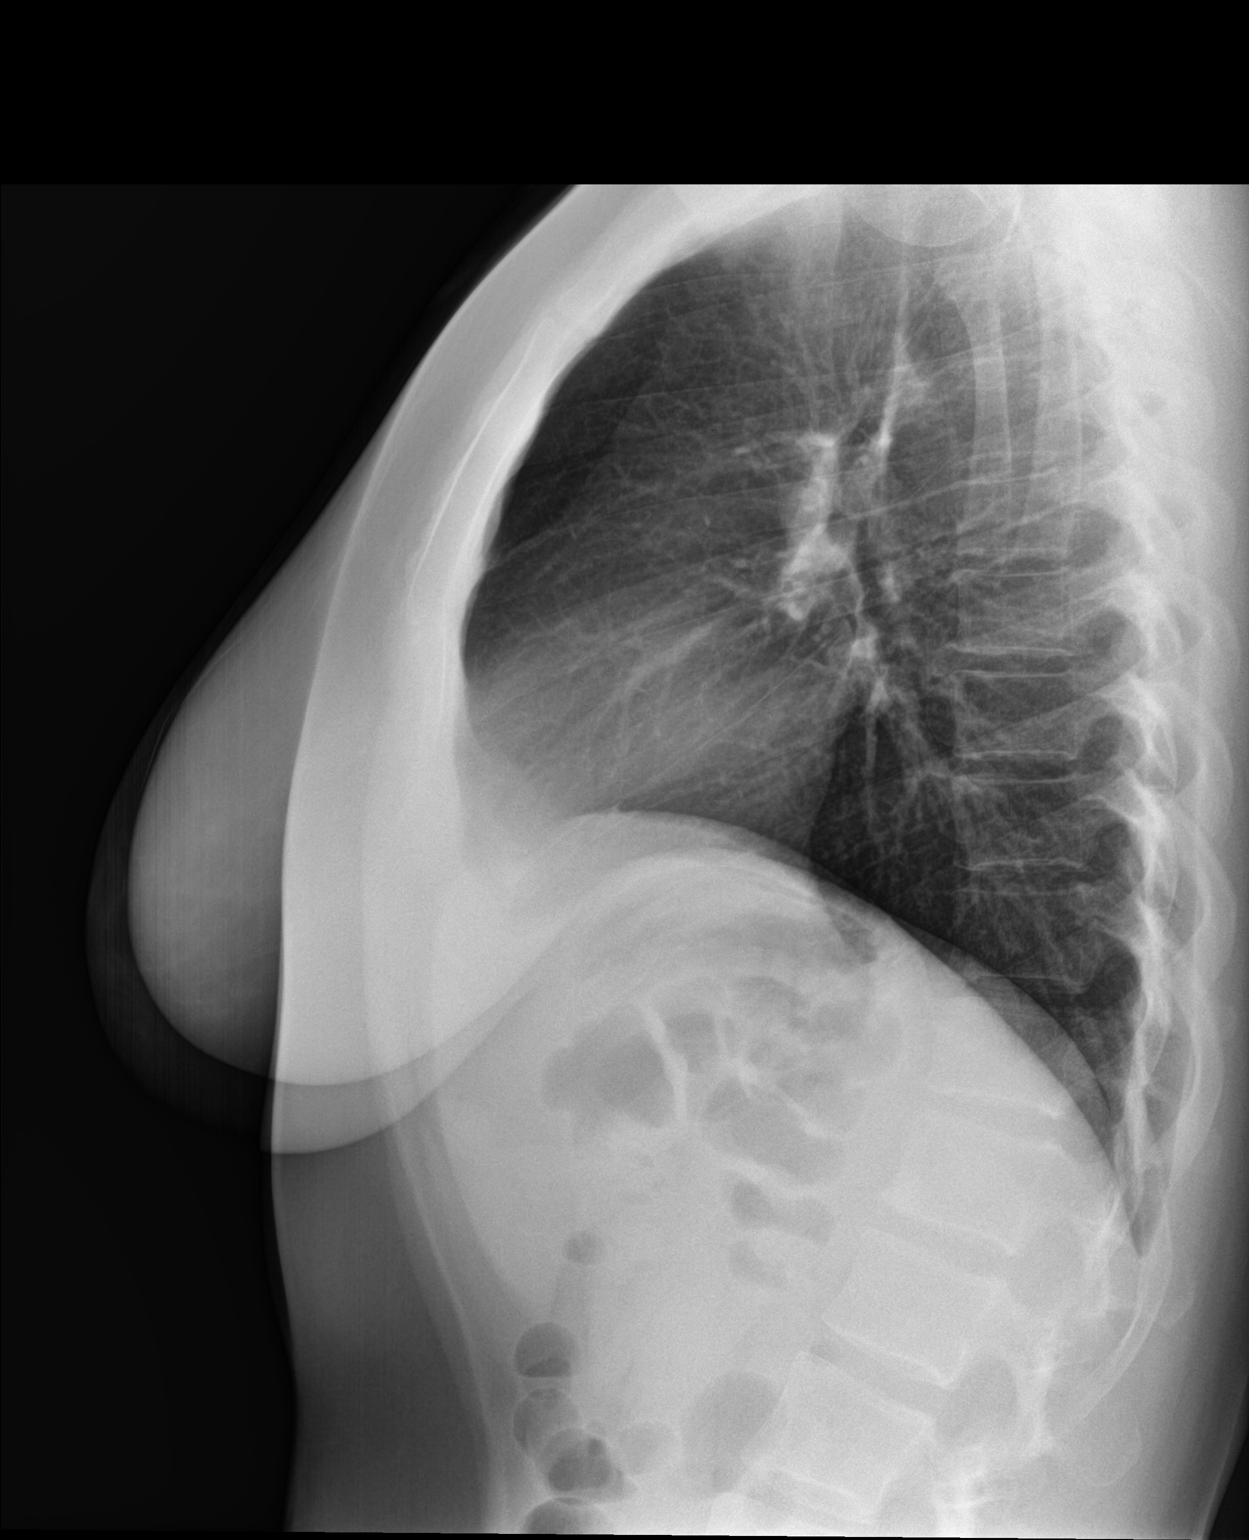

[2 of 2 positions shown; findings below may reference images not displayed]

FINDINGS: The heart size and mediastinal contours are within normal limits.
Both lungs are clear. The visualized skeletal structures are
unremarkable.
IMPRESSION: No active cardiopulmonary disease.

## 2019-02-01 ENCOUNTER — Ambulatory Visit: Payer: BC Managed Care – PPO | Attending: Internal Medicine

## 2019-02-01 ENCOUNTER — Other Ambulatory Visit: Payer: BC Managed Care – PPO

## 2019-02-01 DIAGNOSIS — Z20822 Contact with and (suspected) exposure to covid-19: Secondary | ICD-10-CM

## 2019-02-03 LAB — NOVEL CORONAVIRUS, NAA: SARS-CoV-2, NAA: NOT DETECTED

## 2019-09-11 ENCOUNTER — Telehealth: Payer: Self-pay | Admitting: *Deleted

## 2019-09-11 NOTE — Telephone Encounter (Signed)
A message was left, re: her follow up visit with Dr.Harding. 

## 2019-09-20 ENCOUNTER — Telehealth: Payer: Self-pay | Admitting: Cardiology

## 2019-09-20 NOTE — Telephone Encounter (Signed)
LVM for patient to return call to get follow up scheduled with Harding from recall list 

## 2019-12-13 ENCOUNTER — Encounter: Payer: Self-pay | Admitting: General Practice

## 2019-12-13 ENCOUNTER — Telehealth: Payer: Self-pay | Admitting: Cardiology

## 2019-12-13 NOTE — Telephone Encounter (Signed)
  Recall expunge letter sent 

## 2022-10-17 ENCOUNTER — Encounter (HOSPITAL_COMMUNITY): Payer: Self-pay

## 2022-10-17 ENCOUNTER — Ambulatory Visit (HOSPITAL_COMMUNITY)
Admission: EM | Admit: 2022-10-17 | Discharge: 2022-10-17 | Disposition: A | Discharge status: Home / Self Care | Payer: BC Managed Care – PPO | Attending: Emergency Medicine | Admitting: Emergency Medicine

## 2022-10-17 DIAGNOSIS — S93491A Sprain of other ligament of right ankle, initial encounter: Secondary | ICD-10-CM | POA: Diagnosis not present

## 2022-10-17 DIAGNOSIS — W19XXXA Unspecified fall, initial encounter: Secondary | ICD-10-CM | POA: Diagnosis not present

## 2022-10-17 DIAGNOSIS — S80212A Abrasion, left knee, initial encounter: Secondary | ICD-10-CM

## 2022-10-17 MED ORDER — IBUPROFEN 800 MG PO TABS: 800.0000 mg | ORAL_TABLET | Freq: Three times a day (TID) | ORAL | 0 refills | Status: AC

## 2022-10-17 NOTE — Discharge Instructions (Addendum)
Please wear the brace throughout the day to help support your ankle.  He can also start doing ankle rehab by stretching out your ankle and performing ABCs.  Please rest, ice, compress and elevate.  Take the anti-inflammatories as scheduled with food.  For your abrasion to your knee please clean with warm water and soap, like Dial or Hibiclens and pat dry.  Apply a nonstick dressing during the day to keep it covered.  You can return to clinic or follow-up with an orthopedic if you have no improvement in your foot/ankle pain over the next week or so.

## 2022-10-17 NOTE — ED Provider Notes (Signed)
[facility]    CSN: [id number] Arrival date & time: 10/17/22  1238      History   Chief Complaint Chief Complaint  Patient presents with   Fall    HPI Lisa Valdez is a 42 y.o. female.   Patient presents to clinic over bilateral knee pain and right foot pain from a mechanical fall yesterday.  She was walking on the sidewalk and thought it continued, but instead there was a step downward that she missed.  Her right foot went inward and had external rotation and she fell onto her knees.  She scraped her knees bilaterally, the left is worse than the right.  Both have bruising.  She cleaned with soap and water and iced her knees yesterday.  She also iced her ankle and has been taking ibuprofen.  Reports pain is improved from yesterday in her right foot, but it continues.  She is ambulatory with discomfort.  Last Tdap in 2018.   The history is provided by the patient and medical records.  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 Authorizing Provider  ibuprofen (ADVIL) 800 MG tablet Take 1 tablet (800 mg total) by mouth 3 (three) times daily. 10/17/22  Yes Rinaldo Ratel, [place] N, FNP  escitalopram (LEXAPRO) 10 MG tablet Take 20 mg by mouth  daily.     [provider]  hydrochlorothiazide (HYDRODIURIL) 12.5 MG tablet Take 1 tablet (12.5 mg total) by mouth daily as needed. 10/11/18   Marykay Lex, MD  metoprolol succinate (TOPROL-XL) 25 MG 24 hr tablet Take 1 tablet (25 mg total) by mouth daily. 01/09/18   Jodelle Gross, NP  propranolol (INDERAL) 10 MG tablet Take 1 tablet (10 mg total) by mouth as needed. For increased Heart Rate or Blood Pressure 10/11/18   Marykay Lex, MD  zolpidem (AMBIEN) 10 MG tablet Take 10 mg by mouth at bedtime as needed for sleep.    [provider]    Family History Family History  Problem Relation Age of Onset   Hypertension Mother    Mental illness Maternal Grandmother    Diabetes Maternal Grandmother    Mental illness Paternal Grandfather     Social History Social History   Tobacco Use   Smoking status: Former    Current packs/day: 0.30    Average packs/day: 0.3 packs/day for 2.0 years (0.6 ttl pk-yrs)    Types: Cigarettes  Smokeless tobacco: Never  Vaping Use   Vaping status: Never Used  Substance Use Topics   Alcohol use: Yes    Alcohol/week: 2.0 - 3.0 standard drinks of alcohol    Types: 2 - 3 Standard drinks or equivalent per week    Comment: rare    Drug use: No     Allergies   Patient has no known allergies.   Review of Systems Review of Systems  Neurological:  Negative for syncope.     Physical Exam Triage Vital Signs ED Triage Vitals  Encounter Vitals Group     BP 10/17/22 1327 (!) 146/90     Systolic BP Percentile --      Diastolic BP Percentile --      Pulse Rate 10/17/22 1327 88     Resp 10/17/22 1327 16     Temp 10/17/22 1327 98.1 F (36.7 C)     Temp Source 10/17/22 1327 Oral     SpO2 10/17/22 1327 98 %     Weight 10/17/22 1323 180 lb (81.6 kg)     Height 10/17/22 1323 5\' 4"  (1.626 m)     Head Circumference --      Peak Flow --      Pain Score 10/17/22 1323 4     Pain Loc --      Pain Education --      Exclude from  Growth Chart --    No data found.  Updated Vital Signs BP (!) 146/90 (BP Location: Left Arm)   Pulse 88   Temp 98.1 F (36.7 C) (Oral)   Resp 16   Ht 5\' 4"  (1.626 m)   Wt 180 lb (81.6 kg)   LMP 10/10/2022 (Exact Date)   SpO2 98%   BMI 30.90 kg/m   Visual Acuity Right Eye Distance:   Left Eye Distance:   Bilateral Distance:    Right Eye Near:   Left Eye Near:    Bilateral Near:     Physical Exam Vitals and nursing note reviewed.  Constitutional:      Appearance: Normal appearance.  HENT:     Head: Normocephalic and atraumatic.     Right Ear: External ear normal.     Left Ear: External ear normal.     Nose: Nose normal.     Mouth/Throat:     Mouth: Mucous membranes are moist.  Eyes:     Conjunctiva/sclera: Conjunctivae normal.  Cardiovascular:     Rate and Rhythm: Normal rate.     Pulses: Normal pulses.  Musculoskeletal:        General: Swelling, tenderness and signs of injury present. No deformity. Normal range of motion.       Feet:  Feet:     Comments: TTP over anterior talofibular ligament. No obvious bruising or deformity. ROM intact. Brisk capillary refill with 2+ pedal pulses.  Skin:    General: Skin is warm and dry.     Findings: Bruising present.  Neurological:     General: No focal deficit present.     Mental Status: She is alert and oriented to person, place, and time.  Psychiatric:        Mood and Affect: Mood normal.        Behavior: Behavior normal. Behavior is cooperative.      UC Treatments / Results  Labs (all labs ordered are listed, but only abnormal results are displayed) Labs Reviewed - No data to display  EKG   Radiology No results found.  Procedures Procedures (including critical care time)  Medications Ordered in UC Medications - No data to display  Initial Impression / Assessment and Plan / UC Course  I have reviewed the triage vital signs and the nursing notes.  Pertinent labs & imaging results that were  available during my care of the patient were reviewed by me and considered in my medical decision making (see chart for details).  Vitals and triage reviewed, patient is hemodynamically stable.  Bruising to bilateral knees with abrasion to left knee.  Wound care discussed and provided in clinic.  Right anterior talofibular ligament area with mild TTP.  No obvious deformity.  Shared decision making, imaging deferred at this time.  Suspect sprain.  Will provide with brace and advised RICE.  Ortho follow-up if needed.  Plan of care, follow-up care and return precautions given, no questions at this time.     Final Clinical Impressions(s) / UC Diagnoses   Final diagnoses:  Sprain of anterior talofibular ligament of right ankle, initial encounter  Knee abrasion, left, initial encounter  Fall, initial encounter     Discharge Instructions      Please wear the brace throughout the day to help support your ankle.  He can also start doing ankle rehab by stretching out your ankle and performing ABCs.  Please rest, ice, compress and elevate.  Take the anti-inflammatories as scheduled with food.  For your abrasion to your knee please clean with warm water and soap, like Dial or Hibiclens and pat dry.  Apply a nonstick dressing during the day to keep it covered.  You can return to clinic or follow-up with an orthopedic if you have no improvement in your foot/ankle pain over the next week or so.      ED Prescriptions     Medication Sig Dispense Auth. Provider   ibuprofen (ADVIL) 800 MG tablet Take 1 tablet (800 mg total) by mouth 3 (three) times daily. 21 tablet Nandan Willems, [place], [place]      PDMP not reviewed this encounter.   Nadiya Pieratt, [place] N, [place] 10/17/22 1356

## 2022-10-17 NOTE — ED Triage Notes (Signed)
Patient here today with c/o right ankle pain, Bilat knee pain, and right foot pain since last night from falling. Patient states that she was walking and did not see that there was a step and twisted her right foot and ankle and landed on the concrete on her knees. She has some abrasions to her knees and a bruise on her right knee.
